# Patient Record
Sex: Male | Born: 1950
Health system: Southern US, Community
[De-identification: ages and names within clinical notes are randomized; demographics above are authoritative.]

## PROBLEM LIST (undated history)

## (undated) DIAGNOSIS — K5790 Diverticulosis of intestine, part unspecified, without perforation or abscess without bleeding: Secondary | ICD-10-CM

## (undated) DIAGNOSIS — I77810 Thoracic aortic ectasia: Secondary | ICD-10-CM

## (undated) DIAGNOSIS — J449 Chronic obstructive pulmonary disease, unspecified: Secondary | ICD-10-CM

## (undated) DIAGNOSIS — K648 Other hemorrhoids: Secondary | ICD-10-CM

## (undated) DIAGNOSIS — N529 Male erectile dysfunction, unspecified: Secondary | ICD-10-CM

## (undated) DIAGNOSIS — K7460 Unspecified cirrhosis of liver: Secondary | ICD-10-CM

## (undated) DIAGNOSIS — Z86010 Personal history of colonic polyps: Secondary | ICD-10-CM

## (undated) DIAGNOSIS — I10 Essential (primary) hypertension: Secondary | ICD-10-CM

## (undated) DIAGNOSIS — N400 Enlarged prostate without lower urinary tract symptoms: Secondary | ICD-10-CM

## (undated) DIAGNOSIS — F1010 Alcohol abuse, uncomplicated: Secondary | ICD-10-CM

## (undated) DIAGNOSIS — I509 Heart failure, unspecified: Secondary | ICD-10-CM

## (undated) DIAGNOSIS — N200 Calculus of kidney: Secondary | ICD-10-CM

## (undated) DIAGNOSIS — B1920 Unspecified viral hepatitis C without hepatic coma: Secondary | ICD-10-CM

## (undated) DIAGNOSIS — K644 Residual hemorrhoidal skin tags: Secondary | ICD-10-CM

## (undated) HISTORY — DX: Heart failure, unspecified: I50.9

## (undated) HISTORY — PX: COLONOSCOPY W/ POLYPECTOMY: SHX1380

## (undated) HISTORY — DX: Male erectile dysfunction, unspecified: N52.9

## (undated) HISTORY — DX: Residual hemorrhoidal skin tags: K64.4

## (undated) HISTORY — DX: Unspecified viral hepatitis C without hepatic coma: B19.20

## (undated) HISTORY — DX: Other hemorrhoids: K64.8

## (undated) HISTORY — DX: Calculus of kidney: N20.0

## (undated) HISTORY — DX: Unspecified cirrhosis of liver: K74.60

## (undated) HISTORY — DX: Chronic obstructive pulmonary disease, unspecified: J44.9

## (undated) HISTORY — DX: Diverticulosis of intestine, part unspecified, without perforation or abscess without bleeding: K57.90

## (undated) HISTORY — DX: Personal history of colonic polyps: Z86.010

## (undated) HISTORY — PX: HEMORRHOID SURGERY: SHX153

## (undated) HISTORY — DX: Essential (primary) hypertension: I10

## (undated) HISTORY — DX: Thoracic aortic ectasia: I77.810

## (undated) HISTORY — PX: OTHER SURGICAL HISTORY: SHX169

## (undated) HISTORY — DX: Alcohol abuse, uncomplicated: F10.10

## (undated) HISTORY — DX: Benign prostatic hyperplasia without lower urinary tract symptoms: N40.0

## (undated) HISTORY — PX: LEG SURGERY: SHX1003

---

## 1999-12-21 ENCOUNTER — Encounter: Payer: Self-pay | Admitting: Family Medicine

## 1999-12-21 ENCOUNTER — Encounter: Admission: RE | Admit: 1999-12-21 | Discharge: 1999-12-21 | Payer: Self-pay | Admitting: Family Medicine

## 2000-11-22 ENCOUNTER — Encounter: Payer: Self-pay | Admitting: Orthopedic Surgery

## 2000-11-22 ENCOUNTER — Inpatient Hospital Stay (HOSPITAL_COMMUNITY): Admission: RE | Admit: 2000-11-22 | Discharge: 2000-11-25 | Payer: Self-pay | Admitting: Orthopedic Surgery

## 2001-06-23 ENCOUNTER — Emergency Department (HOSPITAL_COMMUNITY): Admission: EM | Admit: 2001-06-23 | Discharge: 2001-06-24 | Payer: Self-pay | Admitting: Emergency Medicine

## 2002-11-24 ENCOUNTER — Encounter: Payer: Self-pay | Admitting: Internal Medicine

## 2004-02-02 ENCOUNTER — Encounter: Admission: RE | Admit: 2004-02-02 | Discharge: 2004-02-02 | Payer: Self-pay | Admitting: Family Medicine

## 2004-02-09 ENCOUNTER — Ambulatory Visit: Payer: Self-pay | Admitting: Internal Medicine

## 2004-03-03 ENCOUNTER — Ambulatory Visit: Payer: Self-pay | Admitting: Internal Medicine

## 2004-04-13 ENCOUNTER — Encounter (HOSPITAL_COMMUNITY): Admission: RE | Admit: 2004-04-13 | Discharge: 2004-07-12 | Payer: Self-pay | Admitting: Family Medicine

## 2004-04-20 ENCOUNTER — Encounter: Admission: RE | Admit: 2004-04-20 | Discharge: 2004-04-20 | Payer: Self-pay | Admitting: Family Medicine

## 2007-03-17 ENCOUNTER — Ambulatory Visit (HOSPITAL_COMMUNITY): Admission: RE | Admit: 2007-03-17 | Discharge: 2007-03-17 | Payer: Self-pay | Admitting: General Surgery

## 2007-03-17 ENCOUNTER — Encounter (INDEPENDENT_AMBULATORY_CARE_PROVIDER_SITE_OTHER): Payer: Self-pay | Admitting: General Surgery

## 2007-11-08 ENCOUNTER — Encounter: Admission: RE | Admit: 2007-11-08 | Discharge: 2007-11-08 | Payer: Self-pay | Admitting: Neurosurgery

## 2008-08-16 ENCOUNTER — Encounter: Admission: RE | Admit: 2008-08-16 | Discharge: 2008-08-16 | Payer: Self-pay | Admitting: Family Medicine

## 2008-08-16 ENCOUNTER — Encounter: Payer: Self-pay | Admitting: Internal Medicine

## 2008-09-01 DIAGNOSIS — E785 Hyperlipidemia, unspecified: Secondary | ICD-10-CM | POA: Insufficient documentation

## 2008-09-01 DIAGNOSIS — J449 Chronic obstructive pulmonary disease, unspecified: Secondary | ICD-10-CM | POA: Insufficient documentation

## 2008-09-01 DIAGNOSIS — J45909 Unspecified asthma, uncomplicated: Secondary | ICD-10-CM | POA: Insufficient documentation

## 2008-09-02 ENCOUNTER — Ambulatory Visit: Payer: Self-pay | Admitting: Internal Medicine

## 2008-09-02 DIAGNOSIS — R042 Hemoptysis: Secondary | ICD-10-CM | POA: Insufficient documentation

## 2008-10-14 ENCOUNTER — Ambulatory Visit: Payer: Self-pay | Admitting: Internal Medicine

## 2009-03-18 ENCOUNTER — Encounter (INDEPENDENT_AMBULATORY_CARE_PROVIDER_SITE_OTHER): Payer: Self-pay | Admitting: *Deleted

## 2009-04-11 ENCOUNTER — Encounter (INDEPENDENT_AMBULATORY_CARE_PROVIDER_SITE_OTHER): Payer: Self-pay | Admitting: *Deleted

## 2009-04-12 ENCOUNTER — Ambulatory Visit: Payer: Self-pay | Admitting: Internal Medicine

## 2009-04-26 ENCOUNTER — Ambulatory Visit: Payer: Self-pay | Admitting: Internal Medicine

## 2009-04-29 ENCOUNTER — Encounter: Payer: Self-pay | Admitting: Internal Medicine

## 2010-04-04 NOTE — Procedures (Signed)
Summary: Colonoscopy  Patient: Benjamin [name] Note: All result statuses are Final unless otherwise noted.  Tests: (1) Colonoscopy (COL)   COL Colonoscopy           DONE     [facility]     [address]     [place], [place]  [address]           COLONOSCOPY PROCEDURE REPORT           PATIENT:  Benjamin [name], Benjamin [name]  MR#:  [id number]     BIRTHDATE:  Jul 20, 1950, 58 yrs. old  GENDER:  male           ENDOSCOPIST:  Iva Boop, MD, [facility]           PROCEDURE DATE:  04/26/2009     PROCEDURE:  Colonoscopy with biopsy and snare polypectomy     ASA CLASS:  Class II     INDICATIONS:  history of pre-cancerous (adenomatous) colon polyps     surveillance/high-risk screening with prior adenomas (4 and 6 mm)     removed 9/04 (index colonoscopy)           MEDICATIONS:   Fentanyl 75 mcg IV, Versed 8 mg IV           DESCRIPTION OF PROCEDURE:   After the risks benefits and     alternatives of the procedure were thoroughly explained, informed     consent was obtained.  Digital rectal exam was performed and     revealed no abnormalities and normal prostate.   The LB CF-H180AL     [id number] endoscope was introduced through the anus and advanced to     the cecum, which was identified by both the appendix and ileocecal     valve, without limitations.  The quality of the prep was     excellent, using MoviPrep.  The instrument was then slowly     withdrawn as the colon was fully examined.     insertion: 4:02 minutes withdrawal: 22:48 minutes     <<PROCEDUREIMAGES>>           FINDINGS:  Two polyps were found in the right colon. 2 mm and 6 mm     polyps seen in ascending and transverse colon, respectively. The     2mm polyp was removed using cold biopsy forceps. The 6mm polyp was     snared without cautery. Retrieval was successful. Severe     diverticulosis was found in the sigmoid colon. Angulated and     stenosed lumen, making insertion moderately difficult.  A     pedunculated polyp was found in  the sigmoid colon. It was 15 mm in     size. Polyp was snared, then cauterized with monopolar cautery.     Retrieval was successful. snare polyp submucosal injection     endoscopic clip placement Some immediate bleeding of polypectomy     site (steady ooze) treated with 3cc 1:10K EPI and 1 clip.  This     was otherwise a normal examination of the colon.   Retroflexed     views in the rectum revealed internal hemorrhoids.    The scope     was then withdrawn from the patient and the procedure completed.           COMPLICATIONS:  None           ENDOSCOPIC IMPRESSION:     1) Two polyps in the right colon (2 and 6mm, removed)  2) Severe diverticulosis in the sigmoid colon     3) 15 mm pedunculated polyp in the sigmoid colon (removed -     immediate bleeding successfully treated - see above)     4) Internal hemorrhoids     5) Otherwise normal examination, excellent prep           6) Prior adenoma removal 11/2002 ( 6mm maximum)           RECOMMENDATIONS:     1) No aspirin or NSAID's for 2 weeks           REPEAT EXAM:  In for Colonoscopy, pending biopsy results.           Iva Boop, MD, [facility]           CC:  Lanell Persons, MD     The Patient           n.     eSIGNED:   Iva Boop at 04/26/2009 09:42 AM           Odette Horns, [id number]  Note: An exclamation mark (!) indicates a result that was not dispersed into the flowsheet. Document Creation Date: 04/26/2009 9:43 AM _______________________________________________________________________  (1) Order result status: Final Collection or observation date-time: 04/26/2009 09:30 Requested date-time:  Receipt date-time:  Reported date-time:  Referring Physician:   Ordering Physician: Stan Head [phone]) Specimen Source:  Source: Launa Grill Order Number: [id number] Lab site:   Appended Document: Colonoscopy colon recall 3 years  Appended Document: Colonoscopy     Procedures Next Due Date:    Colonoscopy:  05/2012

## 2010-04-04 NOTE — Letter (Signed)
Summary: Previsit letter  [facility]  [address] [place], [place] [address]   Phone: [phone]  Fax: [phone]       03/18/2009 MRN: [id number]  Benjamin [name] [address] [place], [place]  [address]  Dear [name],  Welcome to the Gastroenterology Division at [facility].    You are scheduled to see a nurse for your pre-procedure visit on 04-12-09 at 8:30a.m. on the 3rd floor at [facility], [address]  We ask that you try to arrive at our office 15 minutes prior to your appointment time to allow for check-in.  Your nurse visit will consist of discussing your medical and surgical history, your immediate family medical history, and your medications.    Please bring a complete list of all your medications or, if you prefer, bring the medication bottles and we will list them.  We will need to be aware of both prescribed and over the counter drugs.  We will need to know exact dosage information as well.  If you are on blood thinners (Coumadin, Plavix, Aggrenox, Ticlid, etc.) please call our office today/prior to your appointment, as we need to consult with your physician about holding your medication.   Please be prepared to read and sign documents such as consent forms, a financial agreement, and acknowledgement forms.  If necessary, and with your consent, a friend or relative is welcome to sit-in on the nurse visit with you.  Please bring your insurance card so that we may make a copy of it.  If your insurance requires a referral to see a specialist, please bring your referral form from your primary care physician.  No co-pay is required for this nurse visit.     If you cannot keep your appointment, please call [phone] to cancel or reschedule prior to your appointment date.  This allows [place] the opportunity to schedule an appointment for another patient in need of care.    Thank you for choosing  Gastroenterology for your medical  needs.  We appreciate the opportunity to care for you.  Please visit [place] at our website  to learn more about our practice.                     Sincerely.                                                                                                                   The Gastroenterology Division

## 2010-04-04 NOTE — Miscellaneous (Signed)
Summary: recall colon--ch.  Clinical Lists Changes  Medications: Added new medication of MOVIPREP 100 GM  SOLR (PEG-KCL-NACL-NASULF-NA ASC-C) As directed - Signed Rx of MOVIPREP 100 GM  SOLR (PEG-KCL-NACL-NASULF-NA ASC-C) As directed;  #1 x 0;  Signed;  Entered by: Clide Cliff RN;  Authorized by: Iva Boop MD, FACG;  Method used: Electronically to [facility]. [place]. [phone]*, [address], [place], [place], [place]  [address], Ph: [phone] or [phone], Fax: [phone] Allergies: Changed allergy or adverse reaction from [facility] to [facility]    Prescriptions: MOVIPREP 100 GM  SOLR (PEG-KCL-NACL-NASULF-NA ASC-C) As directed  #1 x 0   Entered by:   Clide Cliff RN   Authorized by:   Iva Boop MD, [facility]   Signed by:   Clide Cliff RN on 04/12/2009   Method used:   Electronically to        [employer] [facility]. [address] [phone]* (retail)       [address]       [place], [place]  [address]       Ph: [phone] or [phone]       Fax: [phone]   RxID:   [id number]

## 2010-04-04 NOTE — Letter (Signed)
Summary: Patient Notice- Polyp Results  [facility]  [address] [place], [place] [address]   Phone: [phone]  Fax: [phone]        April 29, 2009 MRN: [id number]    Benjamin [name] [address] [place], [place]  [address]    Dear Mr. Biel,  The polyps removed from your colon were adenomatous. This means that they were pre-cancerous or that  they had the potential to change into cancer over time.   I recommend that you have a repeat colonoscopy in 3 years to determine if you have developed any new polyps over time. If you develop any new rectal bleeding, abdominal pain or significant bowel habit changes, please contact us before then.  In addition to repeating colonoscopy, changing health habits may reduce your risk of having more colon polyps and possibly, colon cancer. You may lower your risk of future polyps and colon cancer by adopting healthy habits such as not smoking or using tobacco (if you do), being physically active, losing weight (if overweight), and eating a diet which includes fruits and vegetables and limits red meat.  Please call us if you are having persistent problems or have questions about your condition that have not been fully answered at this time.  Sincerely,  Iva Boop MD, [facility]  This letter has been electronically signed by your physician.  Appended Document: Patient Notice- Polyp Results Letter mailed 2.25.11

## 2010-04-04 NOTE — Letter (Signed)
Summary: [facility] Instructions  [facility]  [address] [place], [place] [address]   Phone: [phone]  Fax: [phone]       Benjamin [name]    1950/06/11    MRN: [id number]        Procedure Day /Date: Tuesday 04/26/09     Arrival Time: 8:00 am      Procedure Time: 9:00 am     Location of Procedure:                    _x _  [facility] (4th Floor)                        PREPARATION FOR COLONOSCOPY WITH MOVIPREP   Starting 5 days prior to your procedure 04/21/09 do not eat nuts, seeds, popcorn, corn, beans, peas,  salads, or any raw vegetables.  Do not take any fiber supplements (e.g. Metamucil, Citrucel, and Benefiber).  THE DAY BEFORE YOUR PROCEDURE         DATE: 04/25/09  DAY: Monday  1.  Drink clear liquids the entire day-NO SOLID FOOD  2.  Do not drink anything colored red or purple.  Avoid juices with pulp.  No orange juice.  3.  Drink at least 64 oz. (8 glasses) of fluid/clear liquids during the day to prevent dehydration and help the prep work efficiently.  CLEAR LIQUIDS INCLUDE: Water Jello Ice Popsicles Tea (sugar ok, no milk/cream) Powdered fruit flavored drinks Coffee (sugar ok, no milk/cream) Gatorade Juice: apple, white grape, white cranberry  Lemonade Clear bullion, consomm, broth Carbonated beverages (any kind) Strained chicken noodle soup Hard Candy                             4.  In the morning, mix first dose of MoviPrep solution:    Empty 1 Pouch A and 1 Pouch B into the disposable container    Add lukewarm drinking water to the top line of the container. Mix to dissolve    Refrigerate (mixed solution should be used within 24 hrs)  5.  Begin drinking the prep at 5:00 p.m. The MoviPrep container is divided by 4 marks.   Every 15 minutes drink the solution down to the next mark (approximately 8 oz) until the full liter is complete.   6.  Follow completed prep with 16 oz of clear liquid of your choice (Nothing  red or purple).  Continue to drink clear liquids until bedtime.  7.  Before going to bed, mix second dose of MoviPrep solution:    Empty 1 Pouch A and 1 Pouch B into the disposable container    Add lukewarm drinking water to the top line of the container. Mix to dissolve    Refrigerate  THE DAY OF YOUR PROCEDURE      DATE: 04/26/09 DAY: Tuesday  Beginning at 4:00 a.m. (5 hours before procedure):         1. Every 15 minutes, drink the solution down to the next mark (approx 8 oz) until the full liter is complete.  2. Follow completed prep with 16 oz. of clear liquid of your choice.    3. You may drink clear liquids until 7:00 am(2 HOURS BEFORE PROCEDURE).   MEDICATION INSTRUCTIONS  Unless otherwise instructed, you should take regular prescription medications with a small sip of water   as early as possible the morning of  your procedure.           OTHER INSTRUCTIONS  You will need a responsible adult at least 58 years of age to accompany you and drive you home.   This person must remain in the waiting room during your procedure.  Wear loose fitting clothing that is easily removed.  Leave jewelry and other valuables at home.  However, you may wish to bring a book to read or  an iPod/MP3 player to listen to music as you wait for your procedure to start.  Remove all body piercing jewelry and leave at home.  Total time from sign-in until discharge is approximately 2-3 hours.  You should go home directly after your procedure and rest.  You can resume normal activities the  day after your procedure.  The day of your procedure you should not:   Drive   Make legal decisions   Operate machinery   Drink alcohol   Return to work  You will receive specific instructions about eating, activities and medications before you leave.    The above instructions have been reviewed and explained to me by   Clide Cliff, RN______________________    I fully understand and  can verbalize these instructions _____________________________ Date _________

## 2010-07-18 NOTE — Op Note (Signed)
Benjamin [name], Benjamin [name]                ACCOUNT NO.:  [id number]   MEDICAL RECORD NO.:  [id number]          PATIENT TYPE:  AMB   LOCATION:  SDS                          FACILITY:  [facility]   PHYSICIAN:  Ollen Gross. Vernell Morgans, M.D. DATE OF BIRTH:  1950-04-02   DATE OF PROCEDURE:  03/17/2007  DATE OF DISCHARGE:                               OPERATIVE REPORT   PREOPERATIVE DIAGNOSIS:  Internal and external hemorrhoids.   POSTOPERATIVE DIAGNOSIS:  Internal and external hemorrhoids.   PROCEDURE:  Two column hemorrhoidectomy and one internal hemorrhoid  banding.   SURGEON:  Ollen Gross. Vernell Morgans, M.D.   ANESTHESIA:  General endotracheal.   PROCEDURE:  After informed consent was obtained, the patient was brought  to the operating room, placed in supine position on the table.  After  adequate induction of general anesthesia, the patient was put in  lithotomy position.  His perirectal area was then prepped with Betadine  and draped in usual sterile manner.  The patient had significant  internal and external hemorrhoids.  His perirectal area was infiltrated  with 1 mL of Wydase and 9 mL of Marcaine with epinephrine and the tissue  was massaged gently for several minutes.  Bullet retractor was then  placed in the rectum and the rectum was examined.  In the left and right  posterior positions, the patient had a large internal and external  hemorrhoid complex.  Anteriorly, the patient had one large internal  hemorrhoid.  Attention was first turned to the right posterior  hemorrhoid complex.  This internal and external hemorrhoid complex was  grasped with two Allis clamps and elevated.  The base of this hemorrhoid  complex was scored with a 15 blade knife and then a hemostat was placed  across the vessels.  The hemorrhoid was then excised on top of the  hemostat with Metzenbaum scissors.  The incision was then closed with a  running 2-0 chromic stitch.  The last few mm of the wound was left open  for  drainage.  Another figure-of-eight 2-0 chromic stitch was used for  hemostasis along the midportion of the incision.  Once this was  accomplished, the incision looked good and was hemostatic.  The left  posterior hemorrhoid complex was excised in a similar manner.  It was  clamped and elevated with Allis clamps.  The base of it was scored with  15 blade knife and a hemostat was placed across the vessels.  The  hemorrhoid was then excised with Metzenbaum scissors and the incision  was closed with a running 2-0 chromic stitch.  The last few millimeters  of the  incision were left open for drainage.  Once this was  accomplished, the wound was well approximated and clean and hemostatic.  The anterior internal hemorrhoid complex was then banded without  difficulty.  Care was taken to make sure the band was deep to the  dentate line.  Once this was accomplished, the wounds looked good.  The  perirectal area was infiltrated with the rest of 0.25% Marcaine with  epinephrine.  A piece of Gelfoam with  dibucaine ointment  was placed in the rectum and dibucaine ointment was placed on the  outside of the rectum as well and then sterile dressings were applied.  The patient tolerated procedure well.  At end of the case all needle,  sponge, instrument counts correct.  The patient was awakened, taken  recovery in stable condition.      Ollen Gross. Vernell Morgans, M.D.  Electronically Signed     PST/MEDQ  D:  03/17/2007  T:  03/17/2007  Job:  [id number]

## 2010-07-21 NOTE — Op Note (Signed)
[place]  Patient:    Benjamin [name], Benjamin [name] Visit Number: [id number] MRN: [id number]          Service Type: DSU Location: 5000 [phone] Attending Physician:  Twana First Dictated by:   Benjamin [name] Benjamin [name], M.D. Proc. Date: 11/22/00 Admit Date:  11/22/2000                             Operative Report  PREOPERATIVE DIAGNOSIS:   Right tibial plateau fracture.  POSTOPERATIVE DIAGNOSIS:  Right tibial plateau fracture.  OPERATION:  Open reduction and internal fixation of right tibial plateau fracture with allograft bone graft.  SURGEON:  Benjamin [name]. Benjamin [name], M.D.  ASSISTANT:  Benjamin [name], P.A.  ANESTHESIA:  General.  OPERATIVE TIME:  1 hour and 30 minutes.  COMPLICATIONS:  None.  DESCRIPTION OF PROCEDURE:  Benjamin [name] was brought to the operating room on November 22, 2000, placed on the operating table in a supine position.  He had received vancomycin IV preoperatively for prophylaxis.  After being placed under general anesthesia, his right leg was prepped using sterile Betadine and draped using sterile technique.  Leg was exsanguinated, and thigh tourniquet elevated to 375 mmHg.  Initially through a 20 cm long single incision based over the patellar tendon and proximal tibia, initial exposure was made.  The underlying subcutaneous tissues were incised along with the skin incision.  A median arthrotomy was performed revealing a large hemarthrosis which was evacuated.  The anterior and medial musculature was subperiosteally dissected off the proximal medial tibia for exposure of the medial aspect of the tibial plateau fracture which was easily identified, and the pes insertions of the hamstrings were subperiosteally dissected as well to be repaired at the end of the case.  A lateral arthrotomy was then also performed for exposure to the lateral aspect of the joint.  He was found to have a significant depressed component to his lateral  tibial plateau, and this was elevated up with Benjamin [name] elevators, and then allograft bone graft was packed underneath this to buttress the lateral tibial plateau back up into place.  After this was done, then an eight-[name] 4.5 DCT spoon plate was placed on the proximal medial tibia, and, under fluoroscopic control, the two most proximal screw holes had guidewires for the cannulated 7.3 mm screws placed through them across the fracture site parallel to the joint line buttressing the tibial plateau.  AP and lateral x-rays confirmed satisfactory position of these guide pins.  They were then overdrilled with a 4.5 mm drill, and then two separate 80 x 7.3 mm screws were placed, thus securing and buttressing the proximal aspect of the tibial plateau.  The three most distal screw holes were then drilled, measured, tapped in the appropriate length 4.5 mm cortical screws placed, thus securing the medial tibial plateau fracture back to the shaft in an anatomic position.  Another 6.5 mm screw was then placed proximally, again across the fracture site parallel to the joint line, further reinforcing the proximal tibial plateau fracture.  After this was done, there was found to be excellent fixation of the fracture, excellent position of the hardware.  At this point, then, the lateral meniscus, which had been partially detached to gain access to the lateral joint, was repaired using 2-0 Ethibond suture.  The pes hamstring tendons were reattached to their insertion just over the plate with 0 Vicryl suture.  The arthrotomies were closed with  0 Vicryl suture as well. The subcutaneous tissues were then closed with 0 and 2-0 Vicryl, skin closed with skin staples.  Sterile dressing were applied and a long leg splint. Tourniquet was released and the patient awakened and taken to the recovery room in stable condition.  The wound had been copiously irrigated prior to closing this.  Needles and sponge counts were  correct x 2 at the end of the case. Dictated by:   Benjamin [name] Benjamin [name], M.D. Attending Physician:  Twana First DD:  11/22/00 TD:  11/22/00 Job: [id number]

## 2010-07-21 NOTE — Discharge Summary (Signed)
[place]  Patient:    Benjamin [name], Benjamin [name] Visit Number: [id number] MRN: [id number]          Service Type: DSU Location: 5000 [phone] Attending Physician:  Twana First Dictated by:   Julien Girt, P.A.C. Admit Date:  11/22/2000 Discharge Date: 11/25/2000                             Discharge Summary  ADMISSION DIAGNOSES: 1. Proximal tibia fracture. 2. Reflux. 3. Emphysema.  DISCHARGE DIAGNOSES: 1. Tibial plateau fracture, status post open reduction and internal fixation. 2. Reflux. 3. Emphysema.  PROCEDURES IN HOUSE:  On November 22, 2000, the patient underwent open reduction and internal fixation of his right tibial plateau fracture.  HOSPITAL COURSE:  Postoperatively, he was admitted for pain control and physical therapy.  Postop day #1, T-max 102, hemoglobin 11.7, white cells were 9.1.  BMET was within normal limits.  The patient was seen by physical therapy.  Postop day #2, the patient doing well. Distal neurovascular exam is intact.  T-max 100.2, now of 100.9.  Surgical wound is well-approximated. Pain medicine was increased to OxyContin CR 20 mg b.i.d. and OxyIR.  Postop day #3, the patient progressed better with pain control.  T-max was 101. Lungs had a slightly productive cough.  He has a history of emphysema.  He was discharged to home in stable condition.  Home health nursing for Lovenox teaching, home health PT for ambulation.  He is nonweightbearing on his right lower extremity.  He is on a regular diet.  DISCHARGE MEDICATIONS: 1. OxyContin CR 20 mg b.i.d. 2. OxyIR one to two q.4-6h. p.r.n. pain. 3. Colace 100 mg b.i.d.  FOLLOW-UP:  Will see him back in the office in seven days for suture removal. ictated by:   Julien Girt, P.A.C. Attending Physician:  Twana First DD:  01/22/01 TD:  01/22/01 Job: [id number]

## 2010-11-22 LAB — BASIC METABOLIC PANEL
CO2: 29
Calcium: 10
Creatinine, Ser: 0.84
GFR calc Af Amer: >60
GFR calc non Af Amer: >60
Sodium: 140

## 2010-11-22 LAB — CBC
Hemoglobin: 15.3
MCHC: 34.6
RBC: 5
WBC: 7.5

## 2010-11-22 LAB — DIFFERENTIAL
Basophils Relative: 1
Lymphocytes Relative: 29
Lymphs Abs: 2.2
Monocytes Absolute: 0.7
Monocytes Relative: 9
Neutro Abs: 4.6
Neutrophils Relative %: 61

## 2012-05-15 ENCOUNTER — Encounter: Payer: Self-pay | Admitting: Internal Medicine

## 2012-05-15 DIAGNOSIS — Z860100 Personal history of colon polyps, unspecified: Secondary | ICD-10-CM

## 2012-05-15 HISTORY — DX: Personal history of colon polyps, unspecified: Z86.0100

## 2012-05-15 HISTORY — DX: Personal history of colonic polyps: Z86.010

## 2012-05-16 ENCOUNTER — Encounter: Payer: Self-pay | Admitting: Internal Medicine

## 2012-05-26 ENCOUNTER — Encounter: Payer: Self-pay | Admitting: Internal Medicine

## 2012-07-01 ENCOUNTER — Ambulatory Visit (AMBULATORY_SURGERY_CENTER): Payer: BC Managed Care – PPO

## 2012-07-01 ENCOUNTER — Encounter: Payer: Self-pay | Admitting: Internal Medicine

## 2012-07-01 VITALS — Ht 70.5 in | Wt 200.4 lb

## 2012-07-01 DIAGNOSIS — Z860100 Personal history of colon polyps, unspecified: Secondary | ICD-10-CM

## 2012-07-01 DIAGNOSIS — Z1211 Encounter for screening for malignant neoplasm of colon: Secondary | ICD-10-CM

## 2012-07-01 MED ORDER — NA SULFATE-K SULFATE-MG SULF 17.5-3.13-1.6 GM/177ML PO SOLN: 1.0000 | Freq: Once | ORAL | Status: DC

## 2012-07-15 ENCOUNTER — Ambulatory Visit (AMBULATORY_SURGERY_CENTER): Payer: BC Managed Care – PPO | Admitting: Internal Medicine

## 2012-07-15 ENCOUNTER — Encounter: Payer: Self-pay | Admitting: Internal Medicine

## 2012-07-15 VITALS — BP 143/96 | HR 53 | Temp 97.2°F | Resp 18 | Ht 70.5 in | Wt 200.0 lb

## 2012-07-15 DIAGNOSIS — K5730 Diverticulosis of large intestine without perforation or abscess without bleeding: Secondary | ICD-10-CM

## 2012-07-15 DIAGNOSIS — K648 Other hemorrhoids: Secondary | ICD-10-CM

## 2012-07-15 DIAGNOSIS — Z860100 Personal history of colon polyps, unspecified: Secondary | ICD-10-CM

## 2012-07-15 DIAGNOSIS — K644 Residual hemorrhoidal skin tags: Secondary | ICD-10-CM

## 2012-07-15 DIAGNOSIS — Z1211 Encounter for screening for malignant neoplasm of colon: Secondary | ICD-10-CM

## 2012-07-15 MED ORDER — SODIUM CHLORIDE 0.9 % IV SOLN: 500.0000 mL | INTRAVENOUS | Status: DC

## 2012-07-15 NOTE — Op Note (Signed)
[facility] [address] [place], [address]   COLONOSCOPY PROCEDURE REPORT  PATIENT: [name], Benjamin  MR#: [id number] BIRTHDATE: [birth year] , 62  yrs. old GENDER: Male ENDOSCOPIST: Iva Boop, MD, [facility] PROCEDURE DATE:  07/15/2012 PROCEDURE:   Colonoscopy, surveillance and Colonoscopy, screening  ASA CLASS:   Class II INDICATIONS:Screening and surveillance,personal history of colonic polyps. MEDICATIONS: propofol (Diprivan) 250mg  IV, MAC sedation, administered by CRNA, and These medications were titrated to patient response per physician's verbal order  DESCRIPTION OF PROCEDURE:   After the risks benefits and alternatives of the procedure were thoroughly explained, informed consent was obtained.  A digital rectal exam revealed no rectal mass, A digital rectal exam revealed no prostatic nodules, and A digital rectal exam revealed the prostate was not enlarged.   The LB CF-[id number]  endoscope was introduced through the anus and advanced to the cecum, which was identified by both the appendix and ileocecal valve. No adverse events experienced.   The quality of the prep was excellent using Suprep  The instrument was then slowly withdrawn as the colon was fully examined.    COLON FINDINGS: There was severe diverticulosis noted in the sigmoid colon with associated angulation, muscular hypertrophy and luminal narrowing.   Moderate sized internal and external hemorrhoids were found.   The colon mucosa was otherwise normal.   A right colon retroflexion was performed.  Retroflexed views revealed internal/external hemorrhoids. The time to cecum=5 minutes 0 seconds.  Withdrawal time=9 minutes 0 seconds.  The scope was withdrawn and the procedure completed. COMPLICATIONS: There were no complications.  ENDOSCOPIC IMPRESSION: 1.   There was severe diverticulosis noted in the sigmoid colon 2.   Moderate sized internal and external hemorrhoids 3.   The colon  mucosa was otherwise normal - excellent prep in patient w/ 6 mm adenoma 2005 and 3 adenomas 2011, max 15 mm  RECOMMENDATIONS: Repeat Colonoscopy in 5 years.   eSigned:  Iva Boop, MD, [facility] 07/15/2012 11:54 AM  cc: Beverley Fiedler, MD and The Patient

## 2012-07-15 NOTE — Progress Notes (Signed)
Patient did not experience any of the following events: a burn prior to discharge; a fall within the facility; wrong site/side/patient/procedure/implant event; or a hospital transfer or hospital admission upon discharge from the facility. (G8907) Patient did not have preoperative order for IV antibiotic SSI prophylaxis. (G8918)  

## 2012-07-15 NOTE — Patient Instructions (Addendum)
No polyps today!  You had a great prep also.  I did see diverticulosis and hemorrhoids. If your hemorrhoids bother you - let me know - I am starting an office procedure soon that can help you with these.  Next routine colonoscopy in 5 years - 2019.  I appreciate the opportunity to care for you.  Iva Boop, MD, FACG YOU HAD AN ENDOSCOPIC PROCEDURE TODAY AT THE [facility]: Refer to the procedure report that was given to you for any specific questions about what was found during the examination.  If the procedure report does not answer your questions, please call your gastroenterologist to clarify.  If you requested that your care partner not be given the details of your procedure findings, then the procedure report has been included in a sealed envelope for you to review at your convenience later.  YOU SHOULD EXPECT: Some feelings of bloating in the abdomen. Passage of more gas than usual.  Walking can help get rid of the air that was put into your GI tract during the procedure and reduce the bloating. If you had a lower endoscopy (such as a colonoscopy or flexible sigmoidoscopy) you may notice spotting of blood in your stool or on the toilet paper. If you underwent a bowel prep for your procedure, then you may not have a normal bowel movement for a few days.  DIET: Your first meal following the procedure should be a light meal and then it is ok to progress to your normal diet.  A half-sandwich or bowl of soup is an example of a good first meal.  Heavy or fried foods are harder to digest and may make you feel nauseous or bloated.  Likewise meals heavy in dairy and vegetables can cause extra gas to form and this can also increase the bloating.  Drink plenty of fluids but you should avoid alcoholic beverages for 24 hours.  ACTIVITY: Your care partner should take you home directly after the procedure.  You should plan to take it easy, moving slowly for the rest of the day.  You can  resume normal activity the day after the procedure however you should NOT DRIVE or use heavy machinery for 24 hours (because of the sedation medicines used during the test).    SYMPTOMS TO REPORT IMMEDIATELY: A gastroenterologist can be reached at any hour.  During normal business hours, 8:30 AM to 5:00 PM Monday through Friday, call [phone].  After hours and on weekends, please call the GI answering service at [phone] who will take a message and have the physician on call contact you.   Following lower endoscopy (colonoscopy or flexible sigmoidoscopy):  Excessive amounts of blood in the stool  Significant tenderness or worsening of abdominal pains  Swelling of the abdomen that is new, acute  Fever of 100F or higher   FOLLOW UP: If any biopsies were taken you will be contacted by phone or by letter within the next 1-3 weeks.  Call your gastroenterologist if you have not heard about the biopsies in 3 weeks.  Our staff will call the home number listed on your records the next business day following your procedure to check on you and address any questions or concerns that you may have at that time regarding the information given to you following your procedure. This is a courtesy call and so if there is no answer at the home number and we have not heard from you through the emergency physician on  call, we will assume that you have returned to your regular daily activities without incident.  SIGNATURES/CONFIDENTIALITY: You and/or your care partner have signed paperwork which will be entered into your electronic medical record.  These signatures attest to the fact that that the information above on your After Visit Summary has been reviewed and is understood.  Full responsibility of the confidentiality of this discharge information lies with you and/or your care-partner.   Diverticulosis, hemorrhoid information given.  Repeat colonoscopy in 5 years-2019

## 2012-07-16 ENCOUNTER — Telehealth: Payer: Self-pay | Admitting: *Deleted

## 2012-07-16 NOTE — Telephone Encounter (Signed)
No answer. Number identifier. Message left to call if any questions or concerns. 

## 2013-07-16 ENCOUNTER — Other Ambulatory Visit: Payer: Self-pay | Admitting: Family Medicine

## 2013-07-16 DIAGNOSIS — M545 Low back pain: Principal | ICD-10-CM

## 2013-07-16 DIAGNOSIS — G8929 Other chronic pain: Secondary | ICD-10-CM

## 2013-07-18 ENCOUNTER — Ambulatory Visit
Admission: RE | Admit: 2013-07-18 | Discharge: 2013-07-18 | Disposition: A | Discharge status: Home / Self Care | Payer: BC Managed Care – PPO | Source: Ambulatory Visit | Attending: Family Medicine | Admitting: Family Medicine

## 2013-07-18 ENCOUNTER — Other Ambulatory Visit: Payer: BC Managed Care – PPO

## 2013-07-18 DIAGNOSIS — G8929 Other chronic pain: Secondary | ICD-10-CM

## 2013-07-18 DIAGNOSIS — M545 Low back pain: Principal | ICD-10-CM

## 2013-09-25 ENCOUNTER — Other Ambulatory Visit: Payer: Self-pay | Admitting: Neurosurgery

## 2013-09-25 DIAGNOSIS — M47816 Spondylosis without myelopathy or radiculopathy, lumbar region: Secondary | ICD-10-CM

## 2013-09-30 ENCOUNTER — Ambulatory Visit
Admission: RE | Admit: 2013-09-30 | Discharge: 2013-09-30 | Disposition: A | Discharge status: Home / Self Care | Payer: BC Managed Care – PPO | Source: Ambulatory Visit | Attending: Neurosurgery | Admitting: Neurosurgery

## 2013-09-30 VITALS — BP 119/82 | HR 71

## 2013-09-30 DIAGNOSIS — M47816 Spondylosis without myelopathy or radiculopathy, lumbar region: Secondary | ICD-10-CM

## 2013-09-30 MED ORDER — METHYLPREDNISOLONE ACETATE 40 MG/ML INJ SUSP (RADIOLOG
120.0000 mg | Freq: Once | INTRAMUSCULAR | Status: AC
  Administered 2013-09-30: 120 mg via EPIDURAL

## 2013-09-30 MED ORDER — DIAZEPAM 5 MG PO TABS
10.0000 mg | ORAL_TABLET | Freq: Once | ORAL | Status: AC
  Administered 2013-09-30: 10 mg via ORAL

## 2013-09-30 MED ORDER — IOHEXOL 180 MG/ML  SOLN
1.0000 mL | Freq: Once | INTRAMUSCULAR | Status: AC | PRN
  Administered 2013-09-30: 1 mL via EPIDURAL

## 2013-09-30 NOTE — Discharge Instructions (Signed)

## 2013-12-24 ENCOUNTER — Encounter (HOSPITAL_COMMUNITY): Payer: Self-pay | Admitting: Radiology

## 2013-12-24 ENCOUNTER — Emergency Department (HOSPITAL_COMMUNITY)
Admission: EM | Admit: 2013-12-24 | Discharge: 2013-12-24 | Disposition: A | Discharge status: Home / Self Care | Payer: BC Managed Care – PPO | Attending: Emergency Medicine | Admitting: Emergency Medicine

## 2013-12-24 ENCOUNTER — Emergency Department (HOSPITAL_COMMUNITY): Payer: BC Managed Care – PPO

## 2013-12-24 DIAGNOSIS — N201 Calculus of ureter: Secondary | ICD-10-CM | POA: Insufficient documentation

## 2013-12-24 DIAGNOSIS — I10 Essential (primary) hypertension: Secondary | ICD-10-CM | POA: Insufficient documentation

## 2013-12-24 DIAGNOSIS — Z79899 Other long term (current) drug therapy: Secondary | ICD-10-CM | POA: Diagnosis not present

## 2013-12-24 DIAGNOSIS — N400 Enlarged prostate without lower urinary tract symptoms: Secondary | ICD-10-CM | POA: Insufficient documentation

## 2013-12-24 DIAGNOSIS — Z86010 Personal history of colonic polyps: Secondary | ICD-10-CM | POA: Insufficient documentation

## 2013-12-24 DIAGNOSIS — Z880 Allergy status to penicillin: Secondary | ICD-10-CM | POA: Insufficient documentation

## 2013-12-24 DIAGNOSIS — J449 Chronic obstructive pulmonary disease, unspecified: Secondary | ICD-10-CM | POA: Insufficient documentation

## 2013-12-24 DIAGNOSIS — Z7982 Long term (current) use of aspirin: Secondary | ICD-10-CM | POA: Diagnosis not present

## 2013-12-24 DIAGNOSIS — Z87891 Personal history of nicotine dependence: Secondary | ICD-10-CM | POA: Diagnosis not present

## 2013-12-24 DIAGNOSIS — R1031 Right lower quadrant pain: Secondary | ICD-10-CM | POA: Diagnosis present

## 2013-12-24 LAB — URINALYSIS, ROUTINE W REFLEX MICROSCOPIC
BILIRUBIN URINE: NEGATIVE
Glucose, UA: NEGATIVE mg/dL
KETONES UR: 15 mg/dL — AB
LEUKOCYTES UA: NEGATIVE
NITRITE: NEGATIVE
Protein, ur: NEGATIVE mg/dL
Specific Gravity, Urine: 1.016 (ref 1.005–1.030)
Urobilinogen, UA: 0.2 mg/dL (ref 0.0–1.0)
pH: 7.5 (ref 5.0–8.0)

## 2013-12-24 LAB — COMPREHENSIVE METABOLIC PANEL
ALT: 25 U/L (ref 0–53)
AST: 28 U/L (ref 0–37)
Albumin: 4.1 g/dL (ref 3.5–5.2)
Alkaline Phosphatase: 54 U/L (ref 39–117)
Anion gap: 15 (ref 5–15)
BUN: 12 mg/dL (ref 6–23)
CO2: 25 meq/L (ref 19–32)
Calcium: 9.6 mg/dL (ref 8.4–10.5)
Chloride: 100 mEq/L (ref 96–112)
Creatinine, Ser: 1.03 mg/dL (ref 0.50–1.35)
GFR calc non Af Amer: 76 mL/min — ABNORMAL LOW (ref >90)
GFR, EST AFRICAN AMERICAN: 89 mL/min — AB (ref >90)
Glucose, Bld: 151 mg/dL — ABNORMAL HIGH (ref 70–99)
Potassium: 4.4 mEq/L (ref 3.7–5.3)
Sodium: 140 mEq/L (ref 137–147)
TOTAL PROTEIN: 7.8 g/dL (ref 6.0–8.3)
Total Bilirubin: 0.7 mg/dL (ref 0.3–1.2)

## 2013-12-24 LAB — CBC WITH DIFFERENTIAL/PLATELET
BASOS ABS: 0 10*3/uL (ref 0.0–0.1)
Basophils Relative: 0 % (ref 0–1)
EOS ABS: 0 10*3/uL (ref 0.0–0.7)
EOS PCT: 0 % (ref 0–5)
HEMATOCRIT: 43.1 % (ref 39.0–52.0)
Hemoglobin: 14.8 g/dL (ref 13.0–17.0)
Lymphocytes Relative: 12 % (ref 12–46)
Lymphs Abs: 1.1 10*3/uL (ref 0.7–4.0)
MCH: 30.6 pg (ref 26.0–34.0)
MCHC: 34.3 g/dL (ref 30.0–36.0)
MCV: 89.2 fL (ref 78.0–100.0)
Monocytes Absolute: 0.3 10*3/uL (ref 0.1–1.0)
Monocytes Relative: 3 % (ref 3–12)
Neutro Abs: 8.5 10*3/uL — ABNORMAL HIGH (ref 1.7–7.7)
Neutrophils Relative %: 85 % — ABNORMAL HIGH (ref 43–77)
PLATELETS: 211 10*3/uL (ref 150–400)
RBC: 4.83 MIL/uL (ref 4.22–5.81)
RDW: 12.7 % (ref 11.5–15.5)
WBC: 9.9 10*3/uL (ref 4.0–10.5)

## 2013-12-24 LAB — URINE MICROSCOPIC-ADD ON

## 2013-12-24 MED ORDER — ONDANSETRON 8 MG PO TBDP: 8.0000 mg | ORAL_TABLET | Freq: Three times a day (TID) | ORAL | Status: DC | PRN

## 2013-12-24 MED ORDER — HYDROMORPHONE HCL 1 MG/ML IJ SOLN
1.0000 mg | Freq: Once | INTRAMUSCULAR | Status: AC
  Administered 2013-12-24: 1 mg via INTRAVENOUS
  Filled 2013-12-24: qty 1

## 2013-12-24 MED ORDER — IOHEXOL 300 MG/ML  SOLN
100.0000 mL | Freq: Once | INTRAMUSCULAR | Status: AC | PRN
  Administered 2013-12-24: 100 mL via INTRAVENOUS

## 2013-12-24 MED ORDER — IOHEXOL 300 MG/ML  SOLN
25.0000 mL | Freq: Once | INTRAMUSCULAR | Status: AC | PRN
  Administered 2013-12-24: 25 mL via ORAL

## 2013-12-24 MED ORDER — HYDROMORPHONE HCL 1 MG/ML IJ SOLN
1.0000 mg | Freq: Once | INTRAMUSCULAR | Status: AC
Start: 2013-12-24 — End: 2013-12-24
  Administered 2013-12-24: 1 mg via INTRAVENOUS
  Filled 2013-12-24: qty 1

## 2013-12-24 MED ORDER — IBUPROFEN 600 MG PO TABS: 600.0000 mg | ORAL_TABLET | Freq: Three times a day (TID) | ORAL | Status: AC | PRN

## 2013-12-24 MED ORDER — OXYCODONE-ACETAMINOPHEN 5-325 MG PO TABS: 1.0000 | ORAL_TABLET | ORAL | Status: DC | PRN

## 2013-12-24 MED ORDER — SODIUM CHLORIDE 0.9 % IV SOLN
1000.0000 mL | Freq: Once | INTRAVENOUS | Status: AC
Start: 2013-12-24 — End: 2013-12-24
  Administered 2013-12-24: 1000 mL via INTRAVENOUS

## 2013-12-24 MED ORDER — SODIUM CHLORIDE 0.9 % IV SOLN
1000.0000 mL | INTRAVENOUS | Status: DC
  Administered 2013-12-24: 1000 mL via INTRAVENOUS

## 2013-12-24 MED ORDER — ONDANSETRON HCL 4 MG/2ML IJ SOLN
4.0000 mg | Freq: Once | INTRAMUSCULAR | Status: AC
  Administered 2013-12-24: 4 mg via INTRAVENOUS
  Filled 2013-12-24: qty 2

## 2013-12-24 MED ORDER — KETOROLAC TROMETHAMINE 30 MG/ML IJ SOLN
30.0000 mg | Freq: Once | INTRAMUSCULAR | Status: AC
  Administered 2013-12-24: 30 mg via INTRAVENOUS
  Filled 2013-12-24: qty 1

## 2013-12-24 NOTE — ED Notes (Signed)
Pt alert and oriented at discharge.  Pt ambulatory to the waiting room by this RN.  Pt discharged home with significant other.

## 2013-12-24 NOTE — Discharge Instructions (Signed)

## 2013-12-24 NOTE — ED Provider Notes (Signed)
CSN: [id number]     Arrival date & time 12/24/13  6283 History   First MD Initiated Contact with Patient 12/24/13 1010     Chief Complaint  Patient presents with  . Abdominal Pain      HPI Patient reports developing right groin and right testicular pain last night now with radiation up into his right lower abdomen and his right flank.  No prior history kidney stones.  Reports subjective fever at home.  No urinary complaints.  One episode of nausea vomiting.  No diarrhea.  Denies left-sided abdominal pain.  No prior history of abdominal pain or testicular pain such as this.  He reports his scrotal and testicular pain and discomfort has resolved.   Past Medical History  Diagnosis Date  . Personal history of colonic adenomas 05/15/2012  . COPD (chronic obstructive pulmonary disease)   . Hypertension   . BPH (benign prostatic hyperplasia)    Past Surgical History  Procedure Laterality Date  . Colonoscopy w/ polypectomy    . Dental implants      upper  . Hemorrhoid surgery    . Leg surgery      fx- right repair plate screw   Family History  Problem Relation Age of Onset  . Ovarian cancer Mother   . Prostate cancer Father   . Colon cancer Neg Hx    History  Substance Use Topics  . Smoking status: Former Research scientist (life sciences)  . Smokeless tobacco: Never Used  . Alcohol Use: No    Review of Systems  All other systems reviewed and are negative.     Allergies  Cephalexin and Penicillins  Home Medications   Prior to Admission medications   Medication Sig Start Date End Date Taking? Authorizing Provider  albuterol (PROVENTIL HFA;VENTOLIN HFA) 108 (90 BASE) MCG/ACT inhaler Inhale 2 puffs into the lungs every 6 (six) hours as needed for wheezing or shortness of breath.   Yes Historical Provider, MD  alfuzosin (UROXATRAL) 10 MG 24 hr tablet Take 10 mg by mouth daily with breakfast.   Yes Historical Provider, MD  Ascorbic Acid (VITAMIN C) 1000 MG tablet Take 1,000 mg by mouth daily.   Yes  Historical Provider, MD  aspirin 81 MG tablet Take 81 mg by mouth daily.   Yes Historical Provider, MD  budesonide-formoterol (SYMBICORT) 160-4.5 MCG/ACT inhaler Inhale 2 puffs into the lungs 2 (two) times daily.    Yes Historical Provider, MD  lisinopril (PRINIVIL,ZESTRIL) 20 MG tablet Take 20 mg by mouth daily.   Yes Historical Provider, MD  Multiple Vitamin (MULTIVITAMIN) tablet Take 1 tablet by mouth daily.   Yes Historical Provider, MD  ibuprofen (ADVIL,MOTRIN) 600 MG tablet Take 1 tablet (600 mg total) by mouth every 8 (eight) hours as needed. 12/24/13   Hoy Morn, MD  ondansetron (ZOFRAN ODT) 8 MG disintegrating tablet Take 1 tablet (8 mg total) by mouth every 8 (eight) hours as needed for nausea or vomiting. 12/24/13   Hoy Morn, MD  oxyCODONE-acetaminophen (PERCOCET/ROXICET) 5-325 MG per tablet Take 1 tablet by mouth every 4 (four) hours as needed for severe pain. 12/24/13   Hoy Morn, MD   BP 167/82  Pulse 88  Temp(Src) 99.6 F (37.6 C) (Oral)  Resp 17  SpO2 93% Physical Exam  Nursing note and vitals reviewed. Constitutional: He is oriented to person, place, and time. He appears well-developed and well-nourished.  HENT:  Head: Normocephalic and atraumatic.  Eyes: EOM are normal.  Neck: Normal range of motion.  Cardiovascular: Normal rate, regular rhythm, normal heart sounds and intact distal pulses.   Pulmonary/Chest: Effort normal and breath sounds normal. No respiratory distress.  Abdominal: Soft. He exhibits no distension. There is no tenderness.  Genitourinary:  Normal penis and scrotum.  Testicles normal bilaterally.  No right inguinal swelling or tenderness  Musculoskeletal: Normal range of motion.  Neurological: He is alert and oriented to person, place, and time.  Skin: Skin is warm and dry.  Psychiatric: He has a normal mood and affect. Judgment normal.    ED Course  Procedures (including critical care time) Labs Review Labs Reviewed  CBC WITH  DIFFERENTIAL - Abnormal; Notable for the following:    Neutrophils Relative % 85 (*)    Neutro Abs 8.5 (*)    All other components within normal limits  URINALYSIS, ROUTINE W REFLEX MICROSCOPIC - Abnormal; Notable for the following:    Hgb urine dipstick LARGE (*)    Ketones, ur 15 (*)    All other components within normal limits  COMPREHENSIVE METABOLIC PANEL - Abnormal; Notable for the following:    Glucose, Bld 151 (*)    GFR calc non Af Amer 76 (*)    GFR calc Af Amer 89 (*)    All other components within normal limits  URINE MICROSCOPIC-ADD ON    Imaging Review Ct Abdomen Pelvis W Contrast  12/24/2013   CLINICAL DATA:  Right lower groin pain in right lower quadrant.  EXAM: CT ABDOMEN AND PELVIS WITH CONTRAST  TECHNIQUE: Multidetector CT imaging of the abdomen and pelvis was performed using the standard protocol following bolus administration of intravenous contrast.  CONTRAST:  148mL OMNIPAQUE IOHEXOL 300 MG/ML  SOLN  COMPARISON:  None.  FINDINGS: The lung bases are clear.  The liver demonstrates no focal abnormality. There is no intrahepatic or extrahepatic biliary ductal dilatation. The gallbladder is normal. The spleen demonstrates no focal abnormality.There is a 3 mm proximal right ureteral calculus resulting in mild right hydroureteronephrosis and severe perinephric stranding. There is a small amount of perinephric fluid. There are nonobstructing left renal calculi. The adrenal glands and pancreas are normal. The bladder is unremarkable.  The stomach, duodenum, small intestine, and large intestine demonstrate no contrast extravasation or dilatation. There is diverticulosis without evidence of diverticulitis. There is a left fat containing inguinal hernia. There is no pneumoperitoneum, pneumatosis, or portal venous gas. There is no abdominal or pelvic free fluid. There is no lymphadenopathy.  The abdominal aorta is normal in caliber with atherosclerosis.  There are no lytic or sclerotic  osseous lesions. There is severe degenerative disc disease at L2-3, L3-4, L4-5 and L5-S1. Severe bilateral facet arthropathy at L4-5 and L5-S1.  IMPRESSION: 1. There is a 3 mm proximal right ureteral calculus resulting in mild right hydroureteronephrosis. There is right perinephric stranding and a small amount of perinephric fluid. 2. Nonobstructing left nephrolithiasis.   Electronically Signed   By: Kathreen Devoid   On: 12/24/2013 12:37  I personally reviewed the imaging tests through PACS system I reviewed available ER/hospitalization records through the EMR    EKG Interpretation None      MDM   Final diagnoses:  Right ureteral stone    CT consistent with right ureteral stone.  Pain controlled in the emergency department.  Standard stone precautions given.  Patient feeling better.  Discharge home in good condition.  Outpatient urology followup    Hoy Morn, MD 12/24/13 [phone]

## 2013-12-24 NOTE — ED Notes (Signed)
Patient transported to CT 

## 2013-12-24 NOTE — ED Notes (Signed)
Patient presents to ed c/o pain in his testicle onset 2 am states then the pain moved up into right lower abd. States it felt like a gas bubble tried to walk it off , was able to have small bowel movement states noticed small amt. Mucus in stool. Vomited x 1 this am. Bowels sounds hypoactive. Denies rebound tenderness.

## 2013-12-24 NOTE — ED Notes (Signed)
Pt reports woken up this AM at 230 with right lower groin pain. Pt reports pain has moved to RLQ. Tried eating and drinking this AM but vomited. Unable to pass gas or have bowel movement. Pt noted to be in moderate distress due to pain. Denies fever, diarrhea.

## 2013-12-24 NOTE — ED Notes (Signed)
Drinking contrast for CT.

## 2013-12-25 ENCOUNTER — Encounter (HOSPITAL_COMMUNITY): Payer: Self-pay | Admitting: Emergency Medicine

## 2013-12-25 ENCOUNTER — Emergency Department (HOSPITAL_COMMUNITY)
Admission: EM | Admit: 2013-12-25 | Discharge: 2013-12-26 | Disposition: A | Discharge status: Home / Self Care | Payer: BC Managed Care – PPO | Attending: Emergency Medicine | Admitting: Emergency Medicine

## 2013-12-25 DIAGNOSIS — N1330 Unspecified hydronephrosis: Secondary | ICD-10-CM

## 2013-12-25 DIAGNOSIS — Z880 Allergy status to penicillin: Secondary | ICD-10-CM | POA: Insufficient documentation

## 2013-12-25 DIAGNOSIS — Z792 Long term (current) use of antibiotics: Secondary | ICD-10-CM | POA: Diagnosis not present

## 2013-12-25 DIAGNOSIS — J449 Chronic obstructive pulmonary disease, unspecified: Secondary | ICD-10-CM | POA: Diagnosis not present

## 2013-12-25 DIAGNOSIS — N508 Other specified disorders of male genital organs: Secondary | ICD-10-CM | POA: Insufficient documentation

## 2013-12-25 DIAGNOSIS — R2242 Localized swelling, mass and lump, left lower limb: Secondary | ICD-10-CM | POA: Diagnosis present

## 2013-12-25 DIAGNOSIS — N50811 Right testicular pain: Secondary | ICD-10-CM

## 2013-12-25 DIAGNOSIS — N200 Calculus of kidney: Secondary | ICD-10-CM

## 2013-12-25 DIAGNOSIS — Z87891 Personal history of nicotine dependence: Secondary | ICD-10-CM | POA: Insufficient documentation

## 2013-12-25 DIAGNOSIS — K4091 Unilateral inguinal hernia, without obstruction or gangrene, recurrent: Secondary | ICD-10-CM | POA: Insufficient documentation

## 2013-12-25 DIAGNOSIS — Z7951 Long term (current) use of inhaled steroids: Secondary | ICD-10-CM | POA: Insufficient documentation

## 2013-12-25 DIAGNOSIS — Z9889 Other specified postprocedural states: Secondary | ICD-10-CM | POA: Diagnosis not present

## 2013-12-25 DIAGNOSIS — Z7982 Long term (current) use of aspirin: Secondary | ICD-10-CM | POA: Diagnosis not present

## 2013-12-25 DIAGNOSIS — Z79899 Other long term (current) drug therapy: Secondary | ICD-10-CM | POA: Insufficient documentation

## 2013-12-25 DIAGNOSIS — N179 Acute kidney failure, unspecified: Secondary | ICD-10-CM | POA: Diagnosis not present

## 2013-12-25 DIAGNOSIS — I10 Essential (primary) hypertension: Secondary | ICD-10-CM | POA: Diagnosis not present

## 2013-12-25 DIAGNOSIS — K4090 Unilateral inguinal hernia, without obstruction or gangrene, not specified as recurrent: Secondary | ICD-10-CM

## 2013-12-25 LAB — URINALYSIS, ROUTINE W REFLEX MICROSCOPIC
Bilirubin Urine: NEGATIVE
Glucose, UA: NEGATIVE mg/dL
Ketones, ur: NEGATIVE mg/dL
NITRITE: NEGATIVE
Protein, ur: NEGATIVE mg/dL
SPECIFIC GRAVITY, URINE: 1.003 — AB (ref 1.005–1.030)
UROBILINOGEN UA: 0.2 mg/dL (ref 0.0–1.0)
pH: 5.5 (ref 5.0–8.0)

## 2013-12-25 LAB — URINE MICROSCOPIC-ADD ON

## 2013-12-25 MED ORDER — SODIUM CHLORIDE 0.9 % IV BOLUS (SEPSIS)
500.0000 mL | Freq: Once | INTRAVENOUS | Status: AC
  Administered 2013-12-26: 500 mL via INTRAVENOUS

## 2013-12-25 MED ORDER — HYDROMORPHONE HCL 1 MG/ML IJ SOLN
1.0000 mg | Freq: Once | INTRAMUSCULAR | Status: AC
  Administered 2013-12-26: 1 mg via INTRAVENOUS
  Filled 2013-12-25: qty 1

## 2013-12-25 NOTE — ED Notes (Signed)
Pt reports right flank pain, testicle swelling, and penis swelling. Pt states he was dx with kidney stone two days ago. Pt reports difficulty urinating, and straining his urine with no visible stone.

## 2013-12-26 ENCOUNTER — Emergency Department (HOSPITAL_COMMUNITY): Payer: BC Managed Care – PPO

## 2013-12-26 LAB — BASIC METABOLIC PANEL
Anion gap: 11 (ref 5–15)
BUN: 16 mg/dL (ref 6–23)
CALCIUM: 9.5 mg/dL (ref 8.4–10.5)
CO2: 25 mEq/L (ref 19–32)
Chloride: 98 mEq/L (ref 96–112)
Creatinine, Ser: 1.66 mg/dL — ABNORMAL HIGH (ref 0.50–1.35)
GFR calc Af Amer: 50 mL/min — ABNORMAL LOW (ref >90)
GFR calc non Af Amer: 43 mL/min — ABNORMAL LOW (ref >90)
GLUCOSE: 105 mg/dL — AB (ref 70–99)
Potassium: 4.5 mEq/L (ref 3.7–5.3)
Sodium: 134 mEq/L — ABNORMAL LOW (ref 137–147)

## 2013-12-26 MED ORDER — HYDROMORPHONE HCL 1 MG/ML IJ SOLN: 1.0000 mg | Freq: Once | INTRAMUSCULAR | Status: DC

## 2013-12-26 MED ORDER — CIPROFLOXACIN HCL 500 MG PO TABS: 500.0000 mg | ORAL_TABLET | Freq: Two times a day (BID) | ORAL | Status: DC

## 2013-12-26 NOTE — Discharge Instructions (Signed)
Followup with urology and primary doctor's discussed.  If you were given medicines take as directed.  If you are on coumadin or contraceptives realize their levels and effectiveness is altered by many different medicines.  If you have any reaction (rash, tongues swelling, other) to the medicines stop taking and see a physician.   Please follow up as directed and return to the ER or see a physician for new or worsening symptoms.  Thank you. Filed Vitals:   12/25/13 2330 12/26/13 0000 12/26/13 0014 12/26/13 0030  BP: 145/87 174/92 174/92   Pulse: 101 98 95 102  Temp:      Resp: 31 15 18 18   SpO2: 95% 94% 94% 92%   Avoid ibuprofen or Aleve-type medications until cleared by her doctor for your kidney function.

## 2013-12-26 NOTE — ED Provider Notes (Addendum)
CSN: [id number]     Arrival date & time 12/25/13  2059 History   First MD Initiated Contact with Patient 12/25/13 2303     Chief Complaint  Patient presents with  . Groin Swelling  . Abdominal Pain  . Flank Pain  . Nephrolithiasis     (Consider location/radiation/quality/duration/timing/severity/associated sxs/prior Treatment) HPI Comments: 63 year old male with history of lipids, asthma and recent diagnosis of kidney stone this week 3 mm presents with right lower flank pain radiating to the groin and mild swelling of the groin and right testicle. Patient feels his pain is overall controlled I was concerned for the swelling. No new injuries.  Pain is similar to previous. Patient has urology followup  Patient is a 63 y.o. male presenting with abdominal pain and flank pain. The history is provided by the patient.  Abdominal Pain Associated symptoms: no chest pain, no chills, no dysuria, no fever, no nausea, no shortness of breath and no vomiting   Flank Pain Pertinent negatives include no chest pain, no abdominal pain, no headaches and no shortness of breath.    Past Medical History  Diagnosis Date  . Personal history of colonic adenomas 05/15/2012  . COPD (chronic obstructive pulmonary disease)   . Hypertension   . BPH (benign prostatic hyperplasia)    Past Surgical History  Procedure Laterality Date  . Colonoscopy w/ polypectomy    . Dental implants      upper  . Hemorrhoid surgery    . Leg surgery      fx- right repair plate screw   Family History  Problem Relation Age of Onset  . Ovarian cancer Mother   . Prostate cancer Father   . Colon cancer Neg Hx    History  Substance Use Topics  . Smoking status: Former Research scientist (life sciences)  . Smokeless tobacco: Never Used  . Alcohol Use: No    Review of Systems  Constitutional: Negative for fever and chills.  HENT: Negative for congestion.   Eyes: Negative for visual disturbance.  Respiratory: Negative for shortness of breath.    Cardiovascular: Negative for chest pain.  Gastrointestinal: Negative for nausea, vomiting and abdominal pain.  Genitourinary: Positive for flank pain, scrotal swelling and testicular pain. Negative for dysuria.  Musculoskeletal: Negative for back pain, neck pain and neck stiffness.  Skin: Negative for rash.  Neurological: Negative for light-headedness and headaches.      Allergies  Cephalexin and Penicillins  Home Medications   Prior to Admission medications   Medication Sig Start Date End Date Taking? Authorizing Provider  albuterol (PROVENTIL HFA;VENTOLIN HFA) 108 (90 BASE) MCG/ACT inhaler Inhale 2 puffs into the lungs every 6 (six) hours as needed for wheezing or shortness of breath.   Yes Historical Provider, MD  alfuzosin (UROXATRAL) 10 MG 24 hr tablet Take 10 mg by mouth daily with breakfast.   Yes Historical Provider, MD  Ascorbic Acid (VITAMIN C) 1000 MG tablet Take 1,000 mg by mouth daily.   Yes Historical Provider, MD  aspirin 81 MG tablet Take 81 mg by mouth daily.   Yes Historical Provider, MD  budesonide-formoterol (SYMBICORT) 160-4.5 MCG/ACT inhaler Inhale 2 puffs into the lungs 2 (two) times daily.    Yes Historical Provider, MD  CIALIS 5 MG tablet Take 5 mg by mouth daily as needed for erectile dysfunction.  12/14/13  Yes Historical Provider, MD  ibuprofen (ADVIL,MOTRIN) 600 MG tablet Take 1 tablet (600 mg total) by mouth every 8 (eight) hours as needed. 12/24/13  Yes Metta Clines  Campos, MD  lisinopril (PRINIVIL,ZESTRIL) 20 MG tablet Take 20 mg by mouth daily.   Yes Historical Provider, MD  Multiple Vitamin (MULTIVITAMIN) tablet Take 1 tablet by mouth daily.   Yes Historical Provider, MD  ondansetron (ZOFRAN ODT) 8 MG disintegrating tablet Take 1 tablet (8 mg total) by mouth every 8 (eight) hours as needed for nausea or vomiting. 12/24/13  Yes Hoy Morn, MD  oxyCODONE-acetaminophen (PERCOCET/ROXICET) 5-325 MG per tablet Take 1 tablet by mouth every 4 (four) hours as  needed for severe pain. 12/24/13  Yes Hoy Morn, MD  ciprofloxacin (CIPRO) 500 MG tablet Take 1 tablet (500 mg total) by mouth 2 (two) times daily. One po bid x 7 days 12/26/13   Mariea Clonts, MD   BP 174/92  Pulse 102  Temp(Src) 98.9 F (37.2 C)  Resp 18  SpO2 92% Physical Exam  Nursing note and vitals reviewed. Constitutional: He is oriented to person, place, and time. He appears well-developed and well-nourished.  HENT:  Head: Normocephalic and atraumatic.  Eyes: Conjunctivae are normal. Right eye exhibits no discharge. Left eye exhibits no discharge.  Neck: Normal range of motion. Neck supple. No tracheal deviation present.  Cardiovascular: Normal rate and regular rhythm.   Pulmonary/Chest: Effort normal and breath sounds normal.  Abdominal: Soft. He exhibits no distension. There is tenderness (mild right lower flank and right inguinal region, no hernia appreciated). There is no guarding.  Genitourinary:  Patient is mild testicle swelling and mild tenderness the testicle on the right, no cellulitis or sign of infection, no ecchymosis.  Musculoskeletal: He exhibits no edema.  Neurological: He is alert and oriented to person, place, and time.  Skin: Skin is warm. No rash noted.  Psychiatric: He has a normal mood and affect.    ED Course  Procedures (including critical care time) Emergency Focused Ultrasound Exam Limited retroperitoneal ultrasound of kidneys  Performed and interpreted by Dr. Reather Converse Indication: flank pain Focused abdominal ultrasound with both kidneys imaged in transverse and longitudinal planes in real-time. Interpretation: mild right hydronephrosis visualized.   Images archived electronically  Labs Review Labs Reviewed  URINALYSIS, ROUTINE W REFLEX MICROSCOPIC - Abnormal; Notable for the following:    APPearance CLOUDY (*)    Specific Gravity, Urine 1.003 (*)    Hgb urine dipstick LARGE (*)    Leukocytes, UA TRACE (*)    All other components  within normal limits  BASIC METABOLIC PANEL - Abnormal; Notable for the following:    Sodium 134 (*)    Glucose, Bld 105 (*)    Creatinine, Ser 1.66 (*)    GFR calc non Af Amer 43 (*)    GFR calc Af Amer 50 (*)    All other components within normal limits  URINE MICROSCOPIC-ADD ON    Imaging Review US Scrotum  12/26/2013   CLINICAL DATA:  Right-sided testicular pain.  EXAM: SCROTAL ULTRASOUND  DOPPLER ULTRASOUND OF THE TESTICLES  TECHNIQUE: Complete ultrasound examination of the testicles, epididymis, and other scrotal structures was performed. Color and spectral Doppler ultrasound were also utilized to evaluate blood flow to the testicles.  COMPARISON:  CT abdomen and pelvis 12/24/2013  FINDINGS: Right testicle  Measurements: 3.2 x 1.9 x 2 cm. No mass or microlithiasis visualized.  Left testicle  Measurements: 3.5 x 2.1 x 2.2 cm. No mass or microlithiasis visualized.  Right epididymis:  Normal in size and appearance.  Left epididymis:  Normal in size and appearance.  Hydrocele:  None visualized.  Varicocele:  None visualized.  Pulsed Doppler interrogation of both testes demonstrates low resistance arterial and venous waveforms bilaterally. Normal symmetrical flow is demonstrated to both testes and epididymides on color flow Doppler imaging.  There is bilateral scrotal skin thickening suggesting edema or cellulitis.  There is a a right inguinal hernia containing fat tissues.  IMPRESSION: Normal ultrasound appearance of the testes. No evidence of mass or torsion. Nonspecific scrotal skin thickening suggesting edema or cellulitis. Right inguinal hernia containing fat.   Electronically Signed   By: Lucienne Capers M.D.   On: 12/26/2013 01:43   Ct Abdomen Pelvis W Contrast  12/24/2013   CLINICAL DATA:  Right lower groin pain in right lower quadrant.  EXAM: CT ABDOMEN AND PELVIS WITH CONTRAST  TECHNIQUE: Multidetector CT imaging of the abdomen and pelvis was performed using the standard protocol  following bolus administration of intravenous contrast.  CONTRAST:  163mL OMNIPAQUE IOHEXOL 300 MG/ML  SOLN  COMPARISON:  None.  FINDINGS: The lung bases are clear.  The liver demonstrates no focal abnormality. There is no intrahepatic or extrahepatic biliary ductal dilatation. The gallbladder is normal. The spleen demonstrates no focal abnormality.There is a 3 mm proximal right ureteral calculus resulting in mild right hydroureteronephrosis and severe perinephric stranding. There is a small amount of perinephric fluid. There are nonobstructing left renal calculi. The adrenal glands and pancreas are normal. The bladder is unremarkable.  The stomach, duodenum, small intestine, and large intestine demonstrate no contrast extravasation or dilatation. There is diverticulosis without evidence of diverticulitis. There is a left fat containing inguinal hernia. There is no pneumoperitoneum, pneumatosis, or portal venous gas. There is no abdominal or pelvic free fluid. There is no lymphadenopathy.  The abdominal aorta is normal in caliber with atherosclerosis.  There are no lytic or sclerotic osseous lesions. There is severe degenerative disc disease at L2-3, L3-4, L4-5 and L5-S1. Severe bilateral facet arthropathy at L4-5 and L5-S1.  IMPRESSION: 1. There is a 3 mm proximal right ureteral calculus resulting in mild right hydroureteronephrosis. There is right perinephric stranding and a small amount of perinephric fluid. 2. Nonobstructing left nephrolithiasis.   Electronically Signed   By: Kathreen Devoid   On: 12/24/2013 12:37   [place] Art/ven Flow Abd Pelv Doppler  12/26/2013   CLINICAL DATA:  Right-sided testicular pain.  EXAM: SCROTAL ULTRASOUND  DOPPLER ULTRASOUND OF THE TESTICLES  TECHNIQUE: Complete ultrasound examination of the testicles, epididymis, and other scrotal structures was performed. Color and spectral Doppler ultrasound were also utilized to evaluate blood flow to the testicles.  COMPARISON:  CT abdomen and  pelvis 12/24/2013  FINDINGS: Right testicle  Measurements: 3.2 x 1.9 x 2 cm. No mass or microlithiasis visualized.  Left testicle  Measurements: 3.5 x 2.1 x 2.2 cm. No mass or microlithiasis visualized.  Right epididymis:  Normal in size and appearance.  Left epididymis:  Normal in size and appearance.  Hydrocele:  None visualized.  Varicocele:  None visualized.  Pulsed Doppler interrogation of both testes demonstrates low resistance arterial and venous waveforms bilaterally. Normal symmetrical flow is demonstrated to both testes and epididymides on color flow Doppler imaging.  There is bilateral scrotal skin thickening suggesting edema or cellulitis.  There is a a right inguinal hernia containing fat tissues.  IMPRESSION: Normal ultrasound appearance of the testes. No evidence of mass or torsion. Nonspecific scrotal skin thickening suggesting edema or cellulitis. Right inguinal hernia containing fat.   Electronically Signed   By: Lucienne Capers M.D.   On: 12/26/2013 01:43  EKG Interpretation None      MDM   Final diagnoses:  Testicular pain, right  Right kidney stone  Hydronephrosis of right kidney  Acute renal failure, unspecified acute renal failure type  Right inguinal hernia   Patient with clinically kidney stone, patient had CT scan recently showing 3 mm stone and has urology followup. Mild testicle swelling and mild pain. Plan for ultrasound to look for other causes to confirm blood flow. Pain medicines given.  Pain improved in the ER, acute renal failure needs close to patient followup. Ultrasound testicles no acute findings. Repeat pain medicines given prior to discharge. Discussed small hernia fat-containing. Discussed Cipro for possible early epididymitis until he sees urologist. Results and differential diagnosis were discussed with the patient/parent/guardian. Close follow up outpatient was discussed, comfortable with the plan.   Medications  HYDROmorphone (DILAUDID)  injection 1 mg (not administered)  HYDROmorphone (DILAUDID) injection 1 mg (1 mg Intravenous Given 12/26/13 0030)  sodium chloride 0.9 % bolus 500 mL (0 mLs Intravenous Stopped 12/26/13 0155)    Filed Vitals:   12/25/13 2330 12/26/13 0000 12/26/13 0014 12/26/13 0030  BP: 145/87 174/92 174/92   Pulse: 101 98 95 102  Temp:      Resp: 31 15 18 18   SpO2: 95% 94% 94% 92%    Final diagnoses:  Testicular pain, right  Right kidney stone  Hydronephrosis of right kidney  Acute renal failure, unspecified acute renal failure type  Right inguinal hernia        Mariea Clonts, MD 12/26/13 [id number]  Mariea Clonts, MD 12/26/13 [phone]

## 2014-09-21 ENCOUNTER — Encounter: Payer: Self-pay | Admitting: Internal Medicine

## 2015-02-04 ENCOUNTER — Encounter: Payer: Self-pay | Admitting: Internal Medicine

## 2015-04-01 ENCOUNTER — Other Ambulatory Visit: Payer: Self-pay | Admitting: General Surgery

## 2015-04-07 ENCOUNTER — Ambulatory Visit: Payer: Self-pay | Admitting: Internal Medicine

## 2016-02-29 DIAGNOSIS — J449 Chronic obstructive pulmonary disease, unspecified: Secondary | ICD-10-CM | POA: Insufficient documentation

## 2016-02-29 DIAGNOSIS — I10 Essential (primary) hypertension: Secondary | ICD-10-CM | POA: Insufficient documentation

## 2017-06-27 ENCOUNTER — Encounter: Payer: Self-pay | Admitting: Internal Medicine

## 2017-07-11 DIAGNOSIS — L821 Other seborrheic keratosis: Secondary | ICD-10-CM | POA: Diagnosis not present

## 2017-07-11 DIAGNOSIS — L570 Actinic keratosis: Secondary | ICD-10-CM | POA: Diagnosis not present

## 2017-07-11 DIAGNOSIS — Z85828 Personal history of other malignant neoplasm of skin: Secondary | ICD-10-CM | POA: Diagnosis not present

## 2017-07-11 DIAGNOSIS — B360 Pityriasis versicolor: Secondary | ICD-10-CM | POA: Diagnosis not present

## 2017-07-11 DIAGNOSIS — B070 Plantar wart: Secondary | ICD-10-CM | POA: Diagnosis not present

## 2017-07-11 DIAGNOSIS — L820 Inflamed seborrheic keratosis: Secondary | ICD-10-CM | POA: Diagnosis not present

## 2018-05-05 DIAGNOSIS — Z1159 Encounter for screening for other viral diseases: Secondary | ICD-10-CM | POA: Diagnosis not present

## 2018-05-05 DIAGNOSIS — Z0000 Encounter for general adult medical examination without abnormal findings: Secondary | ICD-10-CM | POA: Diagnosis not present

## 2018-05-05 DIAGNOSIS — I10 Essential (primary) hypertension: Secondary | ICD-10-CM | POA: Diagnosis not present

## 2018-05-05 DIAGNOSIS — Z125 Encounter for screening for malignant neoplasm of prostate: Secondary | ICD-10-CM | POA: Diagnosis not present

## 2018-05-13 DIAGNOSIS — R768 Other specified abnormal immunological findings in serum: Secondary | ICD-10-CM | POA: Diagnosis not present

## 2018-06-25 DIAGNOSIS — B1920 Unspecified viral hepatitis C without hepatic coma: Secondary | ICD-10-CM | POA: Diagnosis not present

## 2018-06-25 DIAGNOSIS — Z86010 Personal history of colonic polyps: Secondary | ICD-10-CM | POA: Diagnosis not present

## 2018-07-15 DIAGNOSIS — L821 Other seborrheic keratosis: Secondary | ICD-10-CM | POA: Diagnosis not present

## 2018-07-15 DIAGNOSIS — L814 Other melanin hyperpigmentation: Secondary | ICD-10-CM | POA: Diagnosis not present

## 2018-07-15 DIAGNOSIS — L820 Inflamed seborrheic keratosis: Secondary | ICD-10-CM | POA: Diagnosis not present

## 2018-07-15 DIAGNOSIS — Z85828 Personal history of other malignant neoplasm of skin: Secondary | ICD-10-CM | POA: Diagnosis not present

## 2018-07-15 DIAGNOSIS — S70362A Insect bite (nonvenomous), left thigh, initial encounter: Secondary | ICD-10-CM | POA: Diagnosis not present

## 2018-07-15 DIAGNOSIS — D1801 Hemangioma of skin and subcutaneous tissue: Secondary | ICD-10-CM | POA: Diagnosis not present

## 2018-07-15 DIAGNOSIS — D2262 Melanocytic nevi of left upper limb, including shoulder: Secondary | ICD-10-CM | POA: Diagnosis not present

## 2018-07-15 DIAGNOSIS — D225 Melanocytic nevi of trunk: Secondary | ICD-10-CM | POA: Diagnosis not present

## 2018-08-14 ENCOUNTER — Other Ambulatory Visit: Payer: Self-pay | Admitting: Gastroenterology

## 2018-08-14 DIAGNOSIS — B1920 Unspecified viral hepatitis C without hepatic coma: Secondary | ICD-10-CM

## 2018-08-29 ENCOUNTER — Ambulatory Visit
Admission: RE | Admit: 2018-08-29 | Discharge: 2018-08-29 | Disposition: A | Discharge status: Home / Self Care | Payer: PPO | Source: Ambulatory Visit | Attending: Gastroenterology | Admitting: Gastroenterology

## 2018-08-29 DIAGNOSIS — R161 Splenomegaly, not elsewhere classified: Secondary | ICD-10-CM | POA: Diagnosis not present

## 2018-08-29 DIAGNOSIS — B1920 Unspecified viral hepatitis C without hepatic coma: Secondary | ICD-10-CM

## 2018-10-14 DIAGNOSIS — B182 Chronic viral hepatitis C: Secondary | ICD-10-CM | POA: Diagnosis not present

## 2019-01-12 DIAGNOSIS — H00011 Hordeolum externum right upper eyelid: Secondary | ICD-10-CM | POA: Diagnosis not present

## 2019-01-12 DIAGNOSIS — Z85828 Personal history of other malignant neoplasm of skin: Secondary | ICD-10-CM | POA: Diagnosis not present

## 2019-01-12 DIAGNOSIS — C4441 Basal cell carcinoma of skin of scalp and neck: Secondary | ICD-10-CM | POA: Diagnosis not present

## 2019-01-12 DIAGNOSIS — L570 Actinic keratosis: Secondary | ICD-10-CM | POA: Diagnosis not present

## 2019-01-12 DIAGNOSIS — L820 Inflamed seborrheic keratosis: Secondary | ICD-10-CM | POA: Diagnosis not present

## 2019-01-12 DIAGNOSIS — L821 Other seborrheic keratosis: Secondary | ICD-10-CM | POA: Diagnosis not present

## 2019-01-13 DIAGNOSIS — Z86010 Personal history of colonic polyps: Secondary | ICD-10-CM | POA: Diagnosis not present

## 2019-01-13 DIAGNOSIS — B1920 Unspecified viral hepatitis C without hepatic coma: Secondary | ICD-10-CM | POA: Diagnosis not present

## 2019-01-15 ENCOUNTER — Other Ambulatory Visit: Payer: Self-pay | Admitting: Gastroenterology

## 2019-01-15 DIAGNOSIS — R768 Other specified abnormal immunological findings in serum: Secondary | ICD-10-CM

## 2019-01-21 ENCOUNTER — Ambulatory Visit
Admission: RE | Admit: 2019-01-21 | Discharge: 2019-01-21 | Disposition: A | Discharge status: Home / Self Care | Payer: PPO | Source: Ambulatory Visit | Attending: Gastroenterology | Admitting: Gastroenterology

## 2019-01-21 DIAGNOSIS — R768 Other specified abnormal immunological findings in serum: Secondary | ICD-10-CM

## 2019-01-21 DIAGNOSIS — B1920 Unspecified viral hepatitis C without hepatic coma: Secondary | ICD-10-CM | POA: Diagnosis not present

## 2019-01-27 DIAGNOSIS — C4441 Basal cell carcinoma of skin of scalp and neck: Secondary | ICD-10-CM | POA: Diagnosis not present

## 2019-01-27 DIAGNOSIS — Z85828 Personal history of other malignant neoplasm of skin: Secondary | ICD-10-CM | POA: Diagnosis not present

## 2019-01-27 DIAGNOSIS — L820 Inflamed seborrheic keratosis: Secondary | ICD-10-CM | POA: Diagnosis not present

## 2019-02-16 DIAGNOSIS — Z1159 Encounter for screening for other viral diseases: Secondary | ICD-10-CM | POA: Diagnosis not present

## 2019-02-19 DIAGNOSIS — K648 Other hemorrhoids: Secondary | ICD-10-CM | POA: Diagnosis not present

## 2019-02-19 DIAGNOSIS — K5730 Diverticulosis of large intestine without perforation or abscess without bleeding: Secondary | ICD-10-CM | POA: Diagnosis not present

## 2019-02-19 DIAGNOSIS — K2970 Gastritis, unspecified, without bleeding: Secondary | ICD-10-CM | POA: Diagnosis not present

## 2019-02-19 DIAGNOSIS — Z86010 Personal history of colonic polyps: Secondary | ICD-10-CM | POA: Diagnosis not present

## 2019-04-03 ENCOUNTER — Ambulatory Visit: Payer: PPO

## 2019-04-11 ENCOUNTER — Ambulatory Visit: Payer: PPO | Attending: Internal Medicine

## 2019-04-11 DIAGNOSIS — Z23 Encounter for immunization: Secondary | ICD-10-CM | POA: Insufficient documentation

## 2019-04-11 NOTE — Progress Notes (Signed)
   Covid-19 Vaccination Clinic  Name:  EBERARDO KALLMEYER    MRN: [id number] DOB: 1950-07-04  04/11/2019  Mr. Dobbyn was observed post Covid-19 immunization for 15 minutes without incidence. He was provided with Vaccine Information Sheet and instruction to access the V-Safe system.   Mr. Schweitzer was instructed to call 911 with any severe reactions post vaccine: Marland Kitchen Difficulty breathing  . Swelling of your face and throat  . A fast heartbeat  . A bad rash all over your body  . Dizziness and weakness    Immunizations Administered    Name Date Dose VIS Date Route   Pfizer COVID-19 Vaccine 04/11/2019  3:23 PM 0.3 mL 02/13/2019 Intramuscular   Manufacturer: [place]   Lot: [id number]   [place]: [id number]

## 2019-04-14 ENCOUNTER — Ambulatory Visit: Payer: PPO

## 2019-05-05 ENCOUNTER — Ambulatory Visit: Payer: PPO | Attending: Internal Medicine

## 2019-05-05 DIAGNOSIS — Z23 Encounter for immunization: Secondary | ICD-10-CM | POA: Insufficient documentation

## 2019-05-05 NOTE — Progress Notes (Signed)
   Covid-19 Vaccination Clinic  Name:  Benjamin [name]    MRN: [id number] DOB: Mar 20, 1950  05/05/2019  Benjamin [name] was observed post Covid-19 immunization for 15 minutes without incident. He was provided with Vaccine Information Sheet and instruction to access the V-Safe system.   Benjamin [name] was instructed to call 911 with any severe reactions post vaccine: Marland Kitchen Difficulty breathing  . Swelling of face and throat  . A fast heartbeat  . A bad rash all over body  . Dizziness and weakness   Immunizations Administered    Name Date Dose VIS Date Route   Pfizer COVID-19 Vaccine 05/05/2019 11:45 AM 0.3 mL 02/13/2019 Intramuscular   Manufacturer: [place]   Lot: [id number]   [place]: [id number]

## 2019-05-06 ENCOUNTER — Ambulatory Visit: Payer: PPO

## 2019-06-17 DIAGNOSIS — J449 Chronic obstructive pulmonary disease, unspecified: Secondary | ICD-10-CM | POA: Diagnosis not present

## 2019-06-17 DIAGNOSIS — B182 Chronic viral hepatitis C: Secondary | ICD-10-CM | POA: Diagnosis not present

## 2019-06-17 DIAGNOSIS — B1920 Unspecified viral hepatitis C without hepatic coma: Secondary | ICD-10-CM | POA: Diagnosis not present

## 2019-06-17 DIAGNOSIS — I10 Essential (primary) hypertension: Secondary | ICD-10-CM | POA: Diagnosis not present

## 2019-06-17 DIAGNOSIS — Z0000 Encounter for general adult medical examination without abnormal findings: Secondary | ICD-10-CM | POA: Diagnosis not present

## 2019-06-17 DIAGNOSIS — N529 Male erectile dysfunction, unspecified: Secondary | ICD-10-CM | POA: Diagnosis not present

## 2019-06-25 ENCOUNTER — Ambulatory Visit: Payer: PPO | Attending: Internal Medicine

## 2019-06-25 DIAGNOSIS — Z20822 Contact with and (suspected) exposure to covid-19: Secondary | ICD-10-CM

## 2019-06-26 LAB — SARS-COV-2, NAA 2 DAY TAT

## 2019-06-26 LAB — NOVEL CORONAVIRUS, NAA: SARS-CoV-2, NAA: NOT DETECTED

## 2019-07-07 ENCOUNTER — Other Ambulatory Visit: Payer: Self-pay | Admitting: Gastroenterology

## 2019-07-07 DIAGNOSIS — B1920 Unspecified viral hepatitis C without hepatic coma: Secondary | ICD-10-CM

## 2019-07-13 ENCOUNTER — Other Ambulatory Visit: Payer: Self-pay

## 2019-07-13 ENCOUNTER — Encounter: Payer: Self-pay | Admitting: Pulmonary Disease

## 2019-07-13 ENCOUNTER — Ambulatory Visit: Payer: PPO | Admitting: Pulmonary Disease

## 2019-07-13 VITALS — BP 112/80 | HR 72 | Temp 98.3°F | Ht 70.5 in | Wt 217.0 lb

## 2019-07-13 DIAGNOSIS — J449 Chronic obstructive pulmonary disease, unspecified: Secondary | ICD-10-CM

## 2019-07-13 DIAGNOSIS — Z87891 Personal history of nicotine dependence: Secondary | ICD-10-CM

## 2019-07-13 DIAGNOSIS — R0602 Shortness of breath: Secondary | ICD-10-CM

## 2019-07-13 MED ORDER — TRELEGY ELLIPTA 100-62.5-25 MCG/INH IN AEPB: 1.0000 | INHALATION_SPRAY | Freq: Every day | RESPIRATORY_TRACT | 5 refills | Status: DC

## 2019-07-13 MED ORDER — ALBUTEROL SULFATE (2.5 MG/3ML) 0.083% IN NEBU: 2.5000 mg | INHALATION_SOLUTION | RESPIRATORY_TRACT | 12 refills | Status: AC | PRN

## 2019-07-13 MED ORDER — TRELEGY ELLIPTA 100-62.5-25 MCG/INH IN AEPB: 1.0000 | INHALATION_SPRAY | Freq: Every day | RESPIRATORY_TRACT | 0 refills | Status: DC

## 2019-07-13 NOTE — Addendum Note (Signed)
Addended by: Lorretta Harp on: 07/13/2019 05:09 PM   Modules accepted: Orders

## 2019-07-13 NOTE — Patient Instructions (Addendum)
Thank you for visiting Dr. Valeta Harms at [facility]. Today we recommend the following:  Orders Placed This Encounter  Procedures  . Pulmonary Function Test   Trelegy inhaler samples today  New prescription for this Keep vitamin d supplement going   Return in about 4 weeks (around 08/10/2019).    Please do your part to reduce the spread of COVID-19.

## 2019-07-13 NOTE — Progress Notes (Signed)
Synopsis: Referred in May 2021 for COPD by Aretta Nip, MD  Subjective:   PATIENT ID: Benjamin [name] GENDER: male DOB: 1950/08/12, MRN: [id number]  Chief Complaint  Patient presents with  . Consult    Pt is being referred by Dr. Radene Ou due to worsening COPD with mild O2 desats.  Pt states he becomes SOB with exertion.     This is a 69 year old gentleman former tobacco abuse history quit smoking when he was 89 smoked from the age 64 for 30 years greater than a pack a day.  Diagnosis COPD in the past.  Has been managed for the past 10 years on Symbicort and as needed albuterol.  He tries to stay active however is limited due to his dyspnea at this point.  Additional medical history includes hypertension BPH.  Lives with his wife.  Recently had first grandchild.  Daughter is a travel Marine scientist in [place].  He recently traveled there to visit.  Also traveled to [facility] to visit son and new grandchild.  Both him and his wife have received their Covid vaccine.  Overall he has had progressive shortness of breath for the past several weeks.  He the pollen outside feels like it is more difficult for him to breathe.  He does have occasional wheezing.  He does not have a nebulizer at home.   Past Medical History:  Diagnosis Date  . BPH (benign prostatic hyperplasia)   . COPD (chronic obstructive pulmonary disease) ([place])   . Diverticulosis   . ED (erectile dysfunction)   . External hemorrhoid   . Hypertension   . Internal hemorrhoid   . Kidney stone   . Personal history of colonic adenomas 05/15/2012     Family History  Problem Relation Age of Onset  . Ovarian cancer Mother   . Prostate cancer Father   . Colon cancer Neg Hx   . Hypertension Father   . Thyroid disease Mother   . Leukemia Mother   . Bone cancer Mother      Past Surgical History:  Procedure Laterality Date  . COLONOSCOPY W/ POLYPECTOMY    . dental implants     upper  . HEMORRHOID SURGERY    . LEG SURGERY  Right    fx- right repair plate screw    Social History   Socioeconomic History  . Marital status: Married    Spouse name: Not on file  . Number of children: Not on file  . Years of education: Not on file  . Highest education level: Not on file  Occupational History  . Not on file  Tobacco Use  . Smoking status: Former Smoker    Packs/day: 2.00    Years: 30.00    Pack years: 60.00    Types: Cigarettes    Quit date: 1999    Years since quitting: 22.3  . Smokeless tobacco: Never Used  Substance and Sexual Activity  . Alcohol use: No    Alcohol/week: 0.0 standard drinks  . Drug use: Yes    Types: Marijuana  . Sexual activity: Not on file  Other Topics Concern  . Not on file  Social History Narrative  . Not on file   Social Determinants of Health   Financial Resource Strain:   . Difficulty of Paying Living Expenses:   Food Insecurity:   . Worried About Charity fundraiser in the Last Year:   . Arboriculturist in the Last Year:   [employer]  Needs:   . Lack of Transportation (Medical):   Marland Kitchen Lack of Transportation (Non-Medical):   Physical Activity:   . Days of Exercise per Week:   . Minutes of Exercise per Session:   Stress:   . Feeling of Stress :   Social Connections:   . Frequency of Communication with Friends and Family:   . Frequency of Social Gatherings with Friends and Family:   . Attends Religious Services:   . Active Member of Clubs or Organizations:   . Attends Archivist Meetings:   Marland Kitchen Marital Status:   Intimate Partner Violence:   . Fear of Current or Ex-Partner:   . Emotionally Abused:   Marland Kitchen Physically Abused:   . Sexually Abused:      Allergies  Allergen Reactions  . Keflex [Cephalexin] Rash  . Penicillins Rash     Outpatient Medications Prior to Visit  Medication Sig Dispense Refill  . albuterol (PROVENTIL HFA;VENTOLIN HFA) 108 (90 BASE) MCG/ACT inhaler Inhale 2 puffs into the lungs every 6 (six) hours as needed for wheezing or  shortness of breath.    . alfuzosin (UROXATRAL) 10 MG 24 hr tablet Take 10 mg by mouth daily with breakfast.    . Ascorbic Acid (VITAMIN C) 1000 MG tablet Take 1,000 mg by mouth daily.    Marland Kitchen aspirin 81 MG tablet Take 81 mg by mouth daily.    . budesonide-formoterol (SYMBICORT) 160-4.5 MCG/ACT inhaler Inhale 2 puffs into the lungs 2 (two) times daily.     Marland Kitchen ibuprofen (ADVIL,MOTRIN) 600 MG tablet Take 1 tablet (600 mg total) by mouth every 8 (eight) hours as needed. 15 tablet 0  . lisinopril (PRINIVIL,ZESTRIL) 20 MG tablet Take 20 mg by mouth daily.    . Multiple Vitamin (MULTIVITAMIN) tablet Take 1 tablet by mouth daily.    Marland Kitchen CIALIS 5 MG tablet Take 5 mg by mouth daily as needed for erectile dysfunction.     . ciprofloxacin (CIPRO) 500 MG tablet Take 1 tablet (500 mg total) by mouth 2 (two) times daily. One po bid x 7 days 14 tablet 0  . ondansetron (ZOFRAN ODT) 8 MG disintegrating tablet Take 1 tablet (8 mg total) by mouth every 8 (eight) hours as needed for nausea or vomiting. 12 tablet 0  . oxyCODONE-acetaminophen (PERCOCET/ROXICET) 5-325 MG per tablet Take 1 tablet by mouth every 4 (four) hours as needed for severe pain. 20 tablet 0   No facility-administered medications prior to visit.    Review of Systems  Constitutional: Negative for chills, fever, malaise/fatigue and weight loss.  HENT: Negative for hearing loss, sore throat and tinnitus.   Eyes: Negative for blurred vision and double vision.  Respiratory: Positive for cough and shortness of breath. Negative for hemoptysis, sputum production, wheezing and stridor.   Cardiovascular: Negative for chest pain, palpitations, orthopnea, leg swelling and PND.  Gastrointestinal: Negative for abdominal pain, constipation, diarrhea, heartburn, nausea and vomiting.  Genitourinary: Negative for dysuria, hematuria and urgency.  Musculoskeletal: Negative for joint pain and myalgias.  Skin: Negative for itching and rash.  Neurological: Negative for  dizziness, tingling, weakness and headaches.  Endo/Heme/Allergies: Negative for environmental allergies. Does not bruise/bleed easily.  Psychiatric/Behavioral: Negative for depression. The patient is not nervous/anxious and does not have insomnia.   All other systems reviewed and are negative.    Objective:  Physical Exam Vitals reviewed.  Constitutional:      General: He is not in acute distress.    Appearance: He is well-developed. He  is obese.  HENT:     Head: Normocephalic and atraumatic.     Mouth/Throat:     Pharynx: No oropharyngeal exudate.  Eyes:     Conjunctiva/sclera: Conjunctivae normal.     Pupils: Pupils are equal, round, and reactive to light.  Neck:     Vascular: No JVD.     Trachea: No tracheal deviation.     Comments: Loss of supraclavicular fat Cardiovascular:     Rate and Rhythm: Normal rate and regular rhythm.     Heart sounds: S1 normal and S2 normal.     Comments: Distant heart tones Pulmonary:     Effort: No tachypnea or accessory muscle usage.     Breath sounds: No stridor. Decreased breath sounds (throughout all lung fields) present. No wheezing, rhonchi or rales.  Abdominal:     General: Bowel sounds are normal. There is no distension.     Palpations: Abdomen is soft.     Tenderness: There is no abdominal tenderness.  Musculoskeletal:        General: No deformity (muscle wasting ).  Skin:    General: Skin is warm and dry.     Capillary Refill: Capillary refill takes less than 2 seconds.     Findings: No rash.  Neurological:     Mental Status: He is alert and oriented to person, place, and time.  Psychiatric:        Behavior: Behavior normal.      Vitals:   07/13/19 1605 07/13/19 1616  BP:  112/80  Pulse: 92 72  Temp:  98.3 F (36.8 C)  TempSrc:  Temporal  SpO2: (!) 88% 96%  Weight: 217 lb (98.4 kg)   Height: 5' 10.5" (1.791 m)    96% on RA BMI Readings from Last 3 Encounters:  07/13/19 30.70 kg/m  07/15/12 28.29 kg/m   07/01/12 28.35 kg/m   Wt Readings from Last 3 Encounters:  07/13/19 217 lb (98.4 kg)  07/15/12 200 lb (90.7 kg)  07/01/12 200 lb 6.4 oz (90.9 kg)     CBC    Component Value Date/Time   WBC 9.9 12/24/2013 0951   RBC 4.83 12/24/2013 0951   HGB 14.8 12/24/2013 0951   HCT 43.1 12/24/2013 0951   PLT 211 12/24/2013 0951   MCV 89.2 12/24/2013 0951   MCH 30.6 12/24/2013 0951   MCHC 34.3 12/24/2013 0951   RDW 12.7 12/24/2013 0951   LYMPHSABS 1.1 12/24/2013 0951   MONOABS 0.3 12/24/2013 0951   EOSABS 0.0 12/24/2013 0951   BASOSABS 0.0 12/24/2013 0951    Chest Imaging: No recent imaging   Pulmonary Functions Testing Results: No flowsheet data found.  FeNO: none   Pathology: none  Echocardiogram: none  Heart Catheterization: none     Assessment & Plan:     ICD-10-CM   1. SOB (shortness of breath)  R06.02 Pulmonary Function Test  2. Former smoker  Z87.891   3. Chronic obstructive pulmonary disease, unspecified COPD type ([place])  J44.9     Discussion: This is a 69 year old obese gentleman, former smoker, 30+ pack year history.  Labeled with COPD in the past.  No prior PFTs.  Plan: Ordered full PFTs to be completed prior to next office visit. Started patient on Trelegy inhaler.  Continue vitamin D supplement. Return to clinic in 4 weeks after PFTs complete.   Current Outpatient Medications:  .  albuterol (PROVENTIL HFA;VENTOLIN HFA) 108 (90 BASE) MCG/ACT inhaler, Inhale 2 puffs into the lungs every 6 (six) hours  as needed for wheezing or shortness of breath., Disp: , Rfl:  .  alfuzosin (UROXATRAL) 10 MG 24 hr tablet, Take 10 mg by mouth daily with breakfast., Disp: , Rfl:  .  Ascorbic Acid (VITAMIN C) 1000 MG tablet, Take 1,000 mg by mouth daily., Disp: , Rfl:  .  aspirin 81 MG tablet, Take 81 mg by mouth daily., Disp: , Rfl:  .  budesonide-formoterol (SYMBICORT) 160-4.5 MCG/ACT inhaler, Inhale 2 puffs into the lungs 2 (two) times daily. , Disp: , Rfl:  .  ibuprofen  (ADVIL,MOTRIN) 600 MG tablet, Take 1 tablet (600 mg total) by mouth every 8 (eight) hours as needed., Disp: 15 tablet, Rfl: 0 .  lisinopril (PRINIVIL,ZESTRIL) 20 MG tablet, Take 20 mg by mouth daily., Disp: , Rfl:  .  Multiple Vitamin (MULTIVITAMIN) tablet, Take 1 tablet by mouth daily., Disp: , Rfl:  .  CIALIS 5 MG tablet, Take 5 mg by mouth daily as needed for erectile dysfunction. , Disp: , Rfl:    Garner Nash, DO [facility] 07/13/2019 4:25 PM

## 2019-07-16 DIAGNOSIS — J449 Chronic obstructive pulmonary disease, unspecified: Secondary | ICD-10-CM | POA: Diagnosis not present

## 2019-07-22 ENCOUNTER — Ambulatory Visit
Admission: RE | Admit: 2019-07-22 | Discharge: 2019-07-22 | Disposition: A | Discharge status: Home / Self Care | Payer: PPO | Source: Ambulatory Visit | Attending: Gastroenterology | Admitting: Gastroenterology

## 2019-07-22 DIAGNOSIS — B1920 Unspecified viral hepatitis C without hepatic coma: Secondary | ICD-10-CM

## 2019-07-30 DIAGNOSIS — L821 Other seborrheic keratosis: Secondary | ICD-10-CM | POA: Diagnosis not present

## 2019-07-30 DIAGNOSIS — B070 Plantar wart: Secondary | ICD-10-CM | POA: Diagnosis not present

## 2019-07-30 DIAGNOSIS — Z85828 Personal history of other malignant neoplasm of skin: Secondary | ICD-10-CM | POA: Diagnosis not present

## 2019-07-30 DIAGNOSIS — D225 Melanocytic nevi of trunk: Secondary | ICD-10-CM | POA: Diagnosis not present

## 2019-07-30 DIAGNOSIS — L570 Actinic keratosis: Secondary | ICD-10-CM | POA: Diagnosis not present

## 2019-07-30 DIAGNOSIS — L814 Other melanin hyperpigmentation: Secondary | ICD-10-CM | POA: Diagnosis not present

## 2019-08-11 ENCOUNTER — Ambulatory Visit: Payer: PPO | Admitting: Adult Health

## 2019-08-11 ENCOUNTER — Encounter: Payer: Self-pay | Admitting: Adult Health

## 2019-08-11 ENCOUNTER — Other Ambulatory Visit: Payer: Self-pay

## 2019-08-11 ENCOUNTER — Ambulatory Visit (INDEPENDENT_AMBULATORY_CARE_PROVIDER_SITE_OTHER): Payer: PPO

## 2019-08-11 VITALS — BP 122/72 | HR 92 | Temp 97.6°F | Ht 70.5 in | Wt 214.6 lb

## 2019-08-11 DIAGNOSIS — J449 Chronic obstructive pulmonary disease, unspecified: Secondary | ICD-10-CM | POA: Diagnosis not present

## 2019-08-11 DIAGNOSIS — R0602 Shortness of breath: Secondary | ICD-10-CM | POA: Diagnosis not present

## 2019-08-11 DIAGNOSIS — J439 Emphysema, unspecified: Secondary | ICD-10-CM | POA: Diagnosis not present

## 2019-08-11 NOTE — Progress Notes (Signed)
PCCM: Thanks for seeing them  Garner Nash, DO [facility] 08/11/2019 5:00 PM

## 2019-08-11 NOTE — Progress Notes (Signed)
@Patient  ID: Benjamin [name], male    DOB: 05/18/1950, 69 y.o.   MRN: [id number]  Chief Complaint  Patient presents with  . Follow-up    COPD     Referring provider: Aretta Nip, MD  HPI: 70 year old male former smoker seen for pulmonary consult to establish for COPD   TEST/EVENTS :   08/11/2019 Follow up : COPD  Patient returns for a 1 month follow-up.  Patient was seen last visit for a pulmonary consult to establish for COPD.  Patient carries a diagnosis of COPD but has had no previous pulmonary function testing.  He has previously been on Symbicort but noticed that breathing was not doing as well.  Last visit he was changed to Trelegy.  Recommend use albuterol inhaler or nebulizers as needed.  Patient says he is feeling better.  Feels that his breathing is more controlled.  He has less shortness of breath and cough.  Patient says he is very active works in the garden does house chores and travels quite a bit.  He still works as a Clinical biochemist.  Covid vaccines are up-to-date x2. Patient denies any chest pain orthopnea PND or increased leg swelling.  No hemoptysis.   Allergies  Allergen Reactions  . Keflex [Cephalexin] Rash  . Penicillins Rash    Immunization History  Administered Date(s) Administered  . Influenza, Quadrivalent, Recombinant, Inj, Pf 12/15/2018  . Influenza-Unspecified 01/05/2015  . PFIZER SARS-COV-2 Vaccination 04/11/2019, 05/05/2019  . Pneumococcal Conjugate-13 02/02/2015  . Pneumococcal-Unspecified 09/26/2010  . Tdap 08/16/2008    Past Medical History:  Diagnosis Date  . BPH (benign prostatic hyperplasia)   . COPD (chronic obstructive pulmonary disease) ([place])   . Diverticulosis   . ED (erectile dysfunction)   . External hemorrhoid   . Hypertension   . Internal hemorrhoid   . Kidney stone   . Personal history of colonic adenomas 05/15/2012    Tobacco History: Social History   Tobacco Use  Smoking Status Former Smoker  . Packs/day:  2.00  . Years: 30.00  . Pack years: 60.00  . Types: Cigarettes  . Quit date: 1999  . Years since quitting: 22.4  Smokeless Tobacco Never Used   Counseling given: Not Answered   Outpatient Medications Prior to Visit  Medication Sig Dispense Refill  . albuterol (PROVENTIL HFA;VENTOLIN HFA) 108 (90 BASE) MCG/ACT inhaler Inhale 2 puffs into the lungs every 6 (six) hours as needed for wheezing or shortness of breath.    Marland Kitchen albuterol (PROVENTIL) (2.5 MG/3ML) 0.083% nebulizer solution Take 3 mLs (2.5 mg total) by nebulization every 4 (four) hours as needed for wheezing or shortness of breath. 75 mL 12  . alfuzosin (UROXATRAL) 10 MG 24 hr tablet Take 10 mg by mouth daily with breakfast.    . Ascorbic Acid (VITAMIN C) 1000 MG tablet Take 1,000 mg by mouth daily.    Marland Kitchen aspirin 81 MG tablet Take 81 mg by mouth daily.    Marland Kitchen CIALIS 5 MG tablet Take 5 mg by mouth daily as needed for erectile dysfunction.     . Fluticasone-Umeclidin-Vilant (TRELEGY ELLIPTA) 100-62.5-25 MCG/INH AEPB Inhale 1 puff into the lungs daily. 60 each 5  . ibuprofen (ADVIL,MOTRIN) 600 MG tablet Take 1 tablet (600 mg total) by mouth every 8 (eight) hours as needed. 15 tablet 0  . lisinopril (PRINIVIL,ZESTRIL) 20 MG tablet Take 20 mg by mouth daily.    . Multiple Vitamin (MULTIVITAMIN) tablet Take 1 tablet by mouth daily.  No facility-administered medications prior to visit.     Review of Systems:   Constitutional:   No  weight loss, night sweats,  Fevers, chills, + fatigue, or  lassitude.  HEENT:   No headaches,  Difficulty swallowing,  Tooth/dental problems, or  Sore throat,                No sneezing, itching, ear ache, nasal congestion, post nasal drip,   CV:  No chest pain,  Orthopnea, PND, swelling in lower extremities, anasarca, dizziness, palpitations, syncope.   GI  No heartburn, indigestion, abdominal pain, nausea, vomiting, diarrhea, change in bowel habits, loss of appetite, bloody stools.   Resp:    No chest  wall deformity  Skin: no rash or lesions.  GU: no dysuria, change in color of urine, no urgency or frequency.  No flank pain, no hematuria   MS:  No joint pain or swelling.  No decreased range of motion.  No back pain.    Physical Exam  BP 122/72 (BP Location: Left Arm, Cuff Size: Normal)   Pulse 92   Temp 97.6 F (36.4 C) (Oral)   Ht 5' 10.5" (1.791 m)   Wt 214 lb 9.6 oz (97.3 kg)   SpO2 94%   BMI 30.36 kg/m   GEN: A/Ox3; pleasant , NAD, BMI 30    HEENT:  [place]/AT,   THROAT-clear, no lesions, no postnasal drip or exudate noted.   NECK:  Supple w/ fair ROM; no JVD; normal carotid impulses w/o bruits; no thyromegaly or nodules palpated; no lymphadenopathy.    RESP  Clear  P & A; w/o, wheezes/ rales/ or rhonchi. no accessory muscle use, no dullness to percussion  CARD:  RRR, no m/r/g, no peripheral edema, pulses intact, no cyanosis or clubbing.  GI:   Soft & nt; nml bowel sounds; no organomegaly or masses detected.   Musco: Warm bil, no deformities or joint swelling noted.   Neuro: alert, no focal deficits noted.    Skin: Warm, no lesions or rashes    Lab Results:  CBC  BMET  ProBNP    No flowsheet data found.  No results found for: NITRICOXIDE      Assessment & Plan:   COPD (chronic obstructive pulmonary disease) ([place]) Improved symptom control on TRELEGY  Check chest xray today  Need PFT   Plan  Patient Instructions  Continue on TRELEGY 1 puff daily , rinse after use  Chest xray today .  Activity as tolerated.  Albuterol inhaler or Neb As needed   Follow up with Dr. Valeta Harms in 2 months with PFT and As needed            Rexene Edison, NP 08/11/2019

## 2019-08-11 NOTE — Patient Instructions (Addendum)
Continue on TRELEGY 1 puff daily , rinse after use  Chest xray today .  Activity as tolerated.  Albuterol inhaler or Neb As needed   Follow up with Dr. Valeta Harms in 2 months with PFT and As needed

## 2019-08-11 NOTE — Progress Notes (Signed)
Error

## 2019-08-11 NOTE — Assessment & Plan Note (Signed)
Improved symptom control on TRELEGY  Check chest xray today  Need PFT   Plan  Patient Instructions  Continue on TRELEGY 1 puff daily , rinse after use  Chest xray today .  Activity as tolerated.  Albuterol inhaler or Neb As needed   Follow up with Dr. Valeta Harms in 2 months with PFT and As needed

## 2019-08-12 DIAGNOSIS — Z86010 Personal history of colonic polyps: Secondary | ICD-10-CM | POA: Diagnosis not present

## 2019-08-12 DIAGNOSIS — B182 Chronic viral hepatitis C: Secondary | ICD-10-CM | POA: Diagnosis not present

## 2019-08-16 DIAGNOSIS — J449 Chronic obstructive pulmonary disease, unspecified: Secondary | ICD-10-CM | POA: Diagnosis not present

## 2019-09-15 DIAGNOSIS — J449 Chronic obstructive pulmonary disease, unspecified: Secondary | ICD-10-CM | POA: Diagnosis not present

## 2019-10-07 ENCOUNTER — Ambulatory Visit: Payer: PPO | Admitting: Primary Care

## 2019-10-07 ENCOUNTER — Other Ambulatory Visit: Payer: Self-pay

## 2019-10-07 ENCOUNTER — Encounter: Payer: Self-pay | Admitting: Primary Care

## 2019-10-07 ENCOUNTER — Ambulatory Visit (INDEPENDENT_AMBULATORY_CARE_PROVIDER_SITE_OTHER): Payer: PPO | Admitting: Pulmonary Disease

## 2019-10-07 VITALS — BP 124/72 | HR 78 | Temp 97.3°F | Ht 72.0 in | Wt 217.0 lb

## 2019-10-07 DIAGNOSIS — R0602 Shortness of breath: Secondary | ICD-10-CM | POA: Diagnosis not present

## 2019-10-07 DIAGNOSIS — J449 Chronic obstructive pulmonary disease, unspecified: Secondary | ICD-10-CM

## 2019-10-07 LAB — PULMONARY FUNCTION TEST
DL/VA % pred: 99 %
DL/VA: 4.07 ml/min/mmHg/L
DLCO cor % pred: 65 %
DLCO cor: 17.24 ml/min/mmHg
DLCO unc % pred: 65 %
DLCO unc: 17.24 ml/min/mmHg
FEF 25-75 Post: 0.61 L/sec
FEF 25-75 Pre: 0.46 L/sec
FEF2575-%Change-Post: 33 %
FEF2575-%Pred-Post: 23 %
FEF2575-%Pred-Pre: 17 %
FEV1-%Change-Post: 12 %
FEV1-%Pred-Post: 39 %
FEV1-%Pred-Pre: 35 %
FEV1-Post: 1.34 L
FEV1-Pre: 1.19 L
FEV1FVC-%Change-Post: 7 %
FEV1FVC-%Pred-Pre: 66 %
FEV6-%Change-Post: 7 %
FEV6-%Pred-Post: 58 %
FEV6-%Pred-Pre: 54 %
FEV6-Post: 2.51 L
FEV6-Pre: 2.34 L
FEV6FVC-%Change-Post: 2 %
FEV6FVC-%Pred-Post: 104 %
FEV6FVC-%Pred-Pre: 101 %
FVC-%Change-Post: 4 %
FVC-%Pred-Post: 55 %
FVC-%Pred-Pre: 53 %
FVC-Post: 2.53 L
FVC-Pre: 2.42 L
Post FEV1/FVC ratio: 53 %
Post FEV6/FVC ratio: 99 %
Pre FEV1/FVC ratio: 49 %
Pre FEV6/FVC Ratio: 97 %
RV % pred: 182 %
RV: 4.36 L
TLC % pred: 99 %
TLC: 6.97 L

## 2019-10-07 MED ORDER — ALBUTEROL SULFATE HFA 108 (90 BASE) MCG/ACT IN AERS: 2.0000 | INHALATION_SPRAY | Freq: Four times a day (QID) | RESPIRATORY_TRACT | 3 refills | Status: DC | PRN

## 2019-10-07 NOTE — Patient Instructions (Signed)
PFTs showed evidence of mixed COPD/asthma   Recommendations: Continue Trelegy 100 one puff daily (rinse mouth after use)  Orders: Labs today (alpha-1 level, cbc and IgE)  Follow-up: 6 months with Dr. Valeta Harms or sooner if you develop bronchitis symptoms/worsening shortness of breath    COPD and Physical Activity Chronic obstructive pulmonary disease (COPD) is a long-term (chronic) condition that affects the lungs. COPD is a general term that can be used to describe many different lung problems that cause lung swelling (inflammation) and limit airflow, including chronic bronchitis and emphysema. The main symptom of COPD is shortness of breath, which makes it harder to do even simple tasks. This can also make it harder to exercise and be active. Talk with your health care provider about treatments to help you breathe better and actions you can take to prevent breathing problems during physical activity. What are the benefits of exercising with COPD? Exercising regularly is an important part of a healthy lifestyle. You can still exercise and do physical activities even though you have COPD. Exercise and physical activity improve your shortness of breath by increasing blood flow (circulation). This causes your heart to pump more oxygen through your body. Moderate exercise can improve your:  Oxygen use.  Energy level.  Shortness of breath.  Strength in your breathing muscles.  Heart health.  Sleep.  Self-esteem and feelings of self-worth.  Depression, stress, and anxiety levels. Exercise can benefit everyone with COPD. The severity of your disease may affect how hard you can exercise, especially at first, but everyone can benefit. Talk with your health care provider about how much exercise is safe for you, and which activities and exercises are safe for you. What actions can I take to prevent breathing problems during physical activity?  Sign up for a pulmonary rehabilitation program.  This type of program may include: ? Education about lung diseases. ? Exercise classes that teach you how to exercise and be more active while improving your breathing. This usually involves:  Exercise using your lower extremities, such as a stationary bicycle.  About 30 minutes of exercise, 2 to 5 times per week, for 6 to 12 weeks  Strength training, such as push ups or leg lifts. ? Nutrition education. ? Group classes in which you can talk with others who also have COPD and learn ways to manage stress.  If you use an oxygen tank, you should use it while you exercise. Work with your health care provider to adjust your oxygen for your physical activity. Your resting flow rate is different from your flow rate during physical activity.  While you are exercising: ? Take slow breaths. ? Pace yourself and do not try to go too fast. ? Purse your lips while breathing out. Pursing your lips is similar to a kissing or whistling position. ? If doing exercise that uses a quick burst of effort, such as weight lifting:  Breathe in before starting the exercise.  Breathe out during the hardest part of the exercise (such as raising the weights). Where to find support You can find support for exercising with COPD from:  Your health care provider.  A pulmonary rehabilitation program.  Your local health department or community health programs.  Support groups, online or in-person. Your health care provider may be able to recommend support groups. Where to find more information You can find more information about exercising with COPD from:  American Lung Association: ClassInsider.se.  COPD Foundation: https://www.rivera.net/. Contact a health care provider if:  Your  symptoms get worse.  You have chest pain.  You have nausea.  You have a fever.  You have trouble talking or catching your breath.  You want to start a new exercise program or a new activity. Summary  COPD is a general term that can  be used to describe many different lung problems that cause lung swelling (inflammation) and limit airflow. This includes chronic bronchitis and emphysema.  Exercise and physical activity improve your shortness of breath by increasing blood flow (circulation). This causes your heart to provide more oxygen to your body.  Contact your health care provider before starting any exercise program or new activity. Ask your health care provider what exercises and activities are safe for you. This information is not intended to replace advice given to you by your health care provider. Make sure you discuss any questions you have with your health care provider. Document Revised: 06/11/2018 Document Reviewed: 03/14/2017 Elsevier Patient Education  2020 [employer].

## 2019-10-07 NOTE — Progress Notes (Signed)
@Patient  ID: Benjamin [name], male    DOB: 1951/03/05, 69 y.o.   MRN: [id number]  Chief Complaint  Patient presents with  . Follow-up    SOB    Referring provider: Aretta Nip, MD  HPI: 69 year old male, former smoker quit in 1999 (60 pack year hx). PMH significant for COPD/asthma, hyperlipidemia. Patient of Dr. Valeta Harms, last seen by pulmonary NP on 08/11/19.   10/07/2019 Patient presents today for regular follow-up for COPD/ASTHMA. He is doing well on Trelegy. He mostly only experiences dyspnea symptoms when walking up incline.  He had to give up tennis because of his breathing. He is still playing pickle ball and continues to work in garden. He is thinking about taking up golf. No signification cough, chest tightness or wheezing.  Pulmonary function testing 10/07/19- FVC 2.53 (55%), FEV1 1.34 (39%), ratio 53, DLCOcor 65% +BD   Allergies  Allergen Reactions  . Keflex [Cephalexin] Rash  . Penicillins Rash    Immunization History  Administered Date(s) Administered  . Influenza, Quadrivalent, Recombinant, Inj, Pf 12/15/2018  . Influenza-Unspecified 01/05/2015  . PFIZER SARS-COV-2 Vaccination 04/11/2019, 05/05/2019  . Pneumococcal Conjugate-13 02/02/2015  . Pneumococcal-Unspecified 09/26/2010  . Tdap 08/16/2008    Past Medical History:  Diagnosis Date  . BPH (benign prostatic hyperplasia)   . COPD (chronic obstructive pulmonary disease) ([place])   . Diverticulosis   . ED (erectile dysfunction)   . External hemorrhoid   . Hypertension   . Internal hemorrhoid   . Kidney stone   . Personal history of colonic adenomas 05/15/2012    Tobacco History: Social History   Tobacco Use  Smoking Status Former Smoker  . Packs/day: 2.00  . Years: 30.00  . Pack years: 60.00  . Types: Cigarettes  . Quit date: 1999  . Years since quitting: 22.6  Smokeless Tobacco Never Used   Counseling given: Not Answered   Outpatient Medications Prior to Visit  Medication Sig Dispense  Refill  . albuterol (PROVENTIL) (2.5 MG/3ML) 0.083% nebulizer solution Take 3 mLs (2.5 mg total) by nebulization every 4 (four) hours as needed for wheezing or shortness of breath. 75 mL 12  . alfuzosin (UROXATRAL) 10 MG 24 hr tablet Take 10 mg by mouth daily with breakfast.    . Ascorbic Acid (VITAMIN C) 1000 MG tablet Take 1,000 mg by mouth daily.    Marland Kitchen aspirin 81 MG tablet Take 81 mg by mouth daily.    Marland Kitchen CIALIS 5 MG tablet Take 5 mg by mouth daily as needed for erectile dysfunction.     . Fluticasone-Umeclidin-Vilant (TRELEGY ELLIPTA) 100-62.5-25 MCG/INH AEPB Inhale 1 puff into the lungs daily. 60 each 5  . ibuprofen (ADVIL,MOTRIN) 600 MG tablet Take 1 tablet (600 mg total) by mouth every 8 (eight) hours as needed. 15 tablet 0  . lisinopril (PRINIVIL,ZESTRIL) 20 MG tablet Take 20 mg by mouth daily.    . Multiple Vitamin (MULTIVITAMIN) tablet Take 1 tablet by mouth daily.    Marland Kitchen albuterol (PROVENTIL HFA;VENTOLIN HFA) 108 (90 BASE) MCG/ACT inhaler Inhale 2 puffs into the lungs every 6 (six) hours as needed for wheezing or shortness of breath.     No facility-administered medications prior to visit.    Review of Systems  Review of Systems  Constitutional: Negative.   HENT: Negative.   Respiratory: Negative.     Physical Exam  BP 124/72 (BP Location: Left Arm, Cuff Size: Normal)   Pulse 78   Temp (!) 97.3 F (36.3 C) (Oral)  Ht 6' (1.829 m)   Wt 217 lb (98.4 kg)   SpO2 94%   BMI 29.43 kg/m  Physical Exam Constitutional:      Appearance: Normal appearance.  HENT:     Head: Normocephalic and atraumatic.  Cardiovascular:     Rate and Rhythm: Normal rate.  Pulmonary:     Effort: Pulmonary effort is normal.  Skin:    General: Skin is warm and dry.  Neurological:     General: No focal deficit present.     Mental Status: He is alert and oriented to person, place, and time. Mental status is at baseline.  Psychiatric:        Mood and Affect: Mood normal.        Behavior:  Behavior normal.        Thought Content: Thought content normal.        Judgment: Judgment normal.      Lab Results:  CBC    Component Value Date/Time   WBC 9.0 10/07/2019 1640   RBC 4.54 10/07/2019 1640   HGB 14.6 10/07/2019 1640   HCT 43.6 10/07/2019 1640   PLT 187.0 10/07/2019 1640   MCV 96.0 10/07/2019 1640   MCH 30.6 12/24/2013 0951   MCHC 33.4 10/07/2019 1640   RDW 13.3 10/07/2019 1640   LYMPHSABS 2.2 10/07/2019 1640   MONOABS 0.6 10/07/2019 1640   EOSABS 0.0 10/07/2019 1640   BASOSABS 0.1 10/07/2019 1640    BMET    Component Value Date/Time   NA 134 (L) 12/25/2013 0010   K 4.5 12/25/2013 0010   CL 98 12/25/2013 0010   CO2 25 12/25/2013 0010   GLUCOSE 105 (H) 12/25/2013 0010   BUN 16 12/25/2013 0010   CREATININE 1.66 (H) 12/25/2013 0010   CALCIUM 9.5 12/25/2013 0010   GFRNONAA 43 (L) 12/25/2013 0010   GFRAA 50 (L) 12/25/2013 0010    BNP No results found for: BNP  ProBNP No results found for: PROBNP  Imaging: No results found.   Assessment & Plan:   COPD (chronic obstructive pulmonary disease) (HCC) - PFTs showed evidence of severe COPD with reversibility  - Clinically doing well on triple therapy. Remains active, experiences dyspnea mainly on inclines - Continue Trelegy 100 one puff daily (rinse mouth after use) - Orders: Labs today (alpha-1 level, cbc and IgE) - Follow-up: 6 months with Dr. Valeta Harms or sooner if you develop bronchitis symptoms/worsening shortness of breath     Martyn Ehrich, NP 10/30/2019

## 2019-10-07 NOTE — Progress Notes (Signed)
Full PFT performed today. °

## 2019-10-08 LAB — CBC WITH DIFFERENTIAL/PLATELET
Basophils Absolute: 0.1 10*3/uL (ref 0.0–0.1)
Basophils Relative: 0.7 % (ref 0.0–3.0)
Eosinophils Absolute: 0 10*3/uL (ref 0.0–0.7)
Eosinophils Relative: 0.2 % (ref 0.0–5.0)
HCT: 43.6 % (ref 39.0–52.0)
Hemoglobin: 14.6 g/dL (ref 13.0–17.0)
Lymphocytes Relative: 24.1 % (ref 12.0–46.0)
Lymphs Abs: 2.2 10*3/uL (ref 0.7–4.0)
MCHC: 33.4 g/dL (ref 30.0–36.0)
MCV: 96 fl (ref 78.0–100.0)
Monocytes Absolute: 0.6 10*3/uL (ref 0.1–1.0)
Monocytes Relative: 7 % (ref 3.0–12.0)
Neutro Abs: 6.1 10*3/uL (ref 1.4–7.7)
Neutrophils Relative %: 68 % (ref 43.0–77.0)
Platelets: 187 10*3/uL (ref 150.0–400.0)
RBC: 4.54 Mil/uL (ref 4.22–5.81)
RDW: 13.3 % (ref 11.5–15.5)
WBC: 9 10*3/uL (ref 4.0–10.5)

## 2019-10-12 ENCOUNTER — Ambulatory Visit: Payer: PPO | Admitting: Adult Health

## 2019-10-13 LAB — ALPHA-1 ANTITRYPSIN PHENOTYPE: A-1 Antitrypsin, Ser: 138 mg/dL (ref 83–199)

## 2019-10-13 LAB — IGE: IgE (Immunoglobulin E), Serum: 11 kU/L (ref <=114)

## 2019-10-16 DIAGNOSIS — J449 Chronic obstructive pulmonary disease, unspecified: Secondary | ICD-10-CM | POA: Diagnosis not present

## 2019-10-30 NOTE — Assessment & Plan Note (Addendum)
-   PFTs showed evidence of severe COPD with reversibility  - Clinically doing well on triple therapy. Remains active, experiences dyspnea mainly on inclines - Continue Trelegy 100 one puff daily (rinse mouth after use) - Orders: Labs today (alpha-1 level, cbc and IgE) - Follow-up: 6 months with Dr. Valeta Harms or sooner if you develop bronchitis symptoms/worsening shortness of breath

## 2019-11-16 DIAGNOSIS — J449 Chronic obstructive pulmonary disease, unspecified: Secondary | ICD-10-CM | POA: Diagnosis not present

## 2019-11-17 ENCOUNTER — Ambulatory Visit: Payer: PPO | Attending: Internal Medicine

## 2019-11-17 DIAGNOSIS — Z23 Encounter for immunization: Secondary | ICD-10-CM

## 2019-11-17 NOTE — Progress Notes (Signed)
   Covid-19 Vaccination Clinic  Name:  WILBERT HAYASHI    MRN: [id number] DOB: [birth year]  11/17/2019  Mr. Dibiasio was observed post Covid-19 immunization for 15 minutes without incident. He was provided with Vaccine Information Sheet and instruction to access the V-Safe system.   Mr. Kalmbach was instructed to call 911 with any severe reactions post vaccine: Marland Kitchen Difficulty breathing  . Swelling of face and throat  . A fast heartbeat  . A bad rash all over body  . Dizziness and weakness

## 2019-12-16 DIAGNOSIS — J449 Chronic obstructive pulmonary disease, unspecified: Secondary | ICD-10-CM | POA: Diagnosis not present

## 2020-01-12 ENCOUNTER — Telehealth: Payer: Self-pay | Admitting: Pulmonary Disease

## 2020-01-12 MED ORDER — TRELEGY ELLIPTA 100-62.5-25 MCG/INH IN AEPB: 1.0000 | INHALATION_SPRAY | Freq: Every day | RESPIRATORY_TRACT | 5 refills | Status: DC

## 2020-01-12 NOTE — Telephone Encounter (Signed)
Pt requesting refill on Trelegy.  This has been sent to preferred pharmacy.  Nothing further needed at this time- will close encounter.   

## 2020-01-16 DIAGNOSIS — J449 Chronic obstructive pulmonary disease, unspecified: Secondary | ICD-10-CM | POA: Diagnosis not present

## 2020-01-19 DIAGNOSIS — L821 Other seborrheic keratosis: Secondary | ICD-10-CM | POA: Diagnosis not present

## 2020-01-19 DIAGNOSIS — D225 Melanocytic nevi of trunk: Secondary | ICD-10-CM | POA: Diagnosis not present

## 2020-01-19 DIAGNOSIS — L820 Inflamed seborrheic keratosis: Secondary | ICD-10-CM | POA: Diagnosis not present

## 2020-01-19 DIAGNOSIS — L814 Other melanin hyperpigmentation: Secondary | ICD-10-CM | POA: Diagnosis not present

## 2020-01-19 DIAGNOSIS — L570 Actinic keratosis: Secondary | ICD-10-CM | POA: Diagnosis not present

## 2020-01-19 DIAGNOSIS — Z85828 Personal history of other malignant neoplasm of skin: Secondary | ICD-10-CM | POA: Diagnosis not present

## 2020-01-19 DIAGNOSIS — D1801 Hemangioma of skin and subcutaneous tissue: Secondary | ICD-10-CM | POA: Diagnosis not present

## 2020-01-19 DIAGNOSIS — B078 Other viral warts: Secondary | ICD-10-CM | POA: Diagnosis not present

## 2020-01-27 ENCOUNTER — Other Ambulatory Visit: Payer: Self-pay | Admitting: Gastroenterology

## 2020-01-27 DIAGNOSIS — B1920 Unspecified viral hepatitis C without hepatic coma: Secondary | ICD-10-CM

## 2020-02-10 DIAGNOSIS — N644 Mastodynia: Secondary | ICD-10-CM | POA: Diagnosis not present

## 2020-02-10 DIAGNOSIS — Z23 Encounter for immunization: Secondary | ICD-10-CM | POA: Diagnosis not present

## 2020-02-10 DIAGNOSIS — F1010 Alcohol abuse, uncomplicated: Secondary | ICD-10-CM | POA: Diagnosis not present

## 2020-02-10 DIAGNOSIS — N62 Hypertrophy of breast: Secondary | ICD-10-CM | POA: Diagnosis not present

## 2020-02-12 ENCOUNTER — Other Ambulatory Visit: Payer: Self-pay | Admitting: Family Medicine

## 2020-02-12 DIAGNOSIS — N644 Mastodynia: Secondary | ICD-10-CM

## 2020-02-12 DIAGNOSIS — N62 Hypertrophy of breast: Secondary | ICD-10-CM

## 2020-02-15 ENCOUNTER — Ambulatory Visit
Admission: RE | Admit: 2020-02-15 | Discharge: 2020-02-15 | Disposition: A | Discharge status: Home / Self Care | Payer: PPO | Source: Ambulatory Visit | Attending: Gastroenterology | Admitting: Gastroenterology

## 2020-02-15 DIAGNOSIS — J449 Chronic obstructive pulmonary disease, unspecified: Secondary | ICD-10-CM | POA: Diagnosis not present

## 2020-02-15 DIAGNOSIS — B1920 Unspecified viral hepatitis C without hepatic coma: Secondary | ICD-10-CM

## 2020-02-15 DIAGNOSIS — K7689 Other specified diseases of liver: Secondary | ICD-10-CM | POA: Diagnosis not present

## 2020-03-17 DIAGNOSIS — J449 Chronic obstructive pulmonary disease, unspecified: Secondary | ICD-10-CM | POA: Diagnosis not present

## 2020-03-23 ENCOUNTER — Other Ambulatory Visit: Payer: PPO

## 2020-03-24 ENCOUNTER — Ambulatory Visit: Payer: PPO

## 2020-03-24 ENCOUNTER — Other Ambulatory Visit: Payer: Self-pay

## 2020-03-24 ENCOUNTER — Ambulatory Visit
Admission: RE | Admit: 2020-03-24 | Discharge: 2020-03-24 | Disposition: A | Discharge status: Home / Self Care | Payer: PPO | Source: Ambulatory Visit | Attending: Family Medicine | Admitting: Family Medicine

## 2020-03-24 DIAGNOSIS — N62 Hypertrophy of breast: Secondary | ICD-10-CM

## 2020-03-24 DIAGNOSIS — N644 Mastodynia: Secondary | ICD-10-CM

## 2020-04-04 ENCOUNTER — Other Ambulatory Visit: Payer: Self-pay

## 2020-04-04 ENCOUNTER — Ambulatory Visit: Payer: PPO | Admitting: Pulmonary Disease

## 2020-04-04 ENCOUNTER — Encounter: Payer: Self-pay | Admitting: Pulmonary Disease

## 2020-04-04 VITALS — BP 120/80 | HR 95 | Temp 97.3°F | Ht 70.5 in | Wt 217.1 lb

## 2020-04-04 DIAGNOSIS — N644 Mastodynia: Secondary | ICD-10-CM | POA: Diagnosis not present

## 2020-04-04 DIAGNOSIS — Z87891 Personal history of nicotine dependence: Secondary | ICD-10-CM

## 2020-04-04 DIAGNOSIS — J449 Chronic obstructive pulmonary disease, unspecified: Secondary | ICD-10-CM

## 2020-04-04 MED ORDER — TRELEGY ELLIPTA 100-62.5-25 MCG/INH IN AEPB: 1.0000 | INHALATION_SPRAY | Freq: Every day | RESPIRATORY_TRACT | 4 refills | Status: AC

## 2020-04-04 NOTE — Patient Instructions (Addendum)
Thank you for visiting Dr. Valeta Harms at [facility]. Today we recommend the following:  Refills of Trelegy inhaler  Return in about 1 year (around 04/04/2021) for with APP or Dr. Valeta Harms.    Please do your part to reduce the spread of COVID-19.

## 2020-04-04 NOTE — Progress Notes (Signed)
Synopsis: Referred in May 2021 for COPD by Aretta Nip, MD  Subjective:   PATIENT ID: Benjamin [name] GENDER: male DOB: 1950-05-25, MRN: [id number]  Chief Complaint  Patient presents with  . Follow-up    Pt has been traveling and had some issues with his breathing but better since being home.  Using his rescue inhaler about once daily.      This is a 70 year old gentleman former tobacco abuse history quit smoking when he was 29 smoked from the age 79 for 30 years greater than a pack a day.  Diagnosis COPD in the past.  Has been managed for the past 10 years on Symbicort and as needed albuterol.  He tries to stay active however is limited due to his dyspnea at this point.  Additional medical history includes hypertension BPH.  Lives with his wife.  Recently had first grandchild.  Daughter is a travel Marine scientist in [place].  He recently traveled there to visit.  Also traveled to [facility] to visit son and new grandchild.  Both him and his wife have received their Covid vaccine.  Overall he has had progressive shortness of breath for the past several weeks.  He the pollen outside feels like it is more difficult for him to breathe.  He does have occasional wheezing.  He does not have a nebulizer at home.  OV 04/04/2020: Here today for COPD follow-up.  Patient had pulmonary function test completed in August with an FEV1 of 39% predicted, elevated TLC and RV ratio concerning for air trapping.  We reviewed patient's pulmonary function test today in the office.  He was switched at that time to Trelegy inhaler.  Has been doing okay with that.  He does feel like his albuterol nebulizer makes his heart race at times.  Otherwise has not had any hospitalizations or any exacerbations this past year after being placed on Trelegy.  He was able to go visit his daughter in [place].  Who works as a travel Marine scientist.   Past Medical History:  Diagnosis Date  . BPH (benign prostatic hyperplasia)   . COPD  (chronic obstructive pulmonary disease) ([place])   . Diverticulosis   . ED (erectile dysfunction)   . External hemorrhoid   . Hypertension   . Internal hemorrhoid   . Kidney stone   . Personal history of colonic adenomas 05/15/2012     Family History  Problem Relation Age of Onset  . Ovarian cancer Mother   . Thyroid disease Mother   . Leukemia Mother   . Bone cancer Mother   . Prostate cancer Father   . Hypertension Father   . Colon cancer Neg Hx      Past Surgical History:  Procedure Laterality Date  . COLONOSCOPY W/ POLYPECTOMY    . dental implants     upper  . HEMORRHOID SURGERY    . LEG SURGERY Right    fx- right repair plate screw    Social History   Socioeconomic History  . Marital status: Married    Spouse name: Not on file  . Number of children: Not on file  . Years of education: Not on file  . Highest education level: Not on file  Occupational History  . Not on file  Tobacco Use  . Smoking status: Former Smoker    Packs/day: 2.00    Years: 30.00    Pack years: 60.00    Types: Cigarettes    Quit date: 1999  Years since quitting: 23.0  . Smokeless tobacco: Never Used  Substance and Sexual Activity  . Alcohol use: No    Alcohol/week: 0.0 standard drinks  . Drug use: Yes    Types: Marijuana  . Sexual activity: Not on file  Other Topics Concern  . Not on file  Social History Narrative  . Not on file   Social Determinants of Health   Financial Resource Strain: Not on file  Food Insecurity: Not on file  Transportation Needs: Not on file  Physical Activity: Not on file  Stress: Not on file  Social Connections: Not on file  Intimate Partner Violence: Not on file     Allergies  Allergen Reactions  . Keflex [Cephalexin] Rash  . Penicillins Rash     Outpatient Medications Prior to Visit  Medication Sig Dispense Refill  . albuterol (PROVENTIL) (2.5 MG/3ML) 0.083% nebulizer solution Take 3 mLs (2.5 mg total) by nebulization every 4 (four)  hours as needed for wheezing or shortness of breath. 75 mL 12  . albuterol (VENTOLIN HFA) 108 (90 Base) MCG/ACT inhaler Inhale 2 puffs into the lungs every 6 (six) hours as needed for wheezing or shortness of breath. 108 g 3  . alfuzosin (UROXATRAL) 10 MG 24 hr tablet Take 10 mg by mouth daily with breakfast.    . Ascorbic Acid (VITAMIN C) 1000 MG tablet Take 1,000 mg by mouth daily.    Marland Kitchen aspirin 81 MG tablet Take 81 mg by mouth daily.    Marland Kitchen CIALIS 5 MG tablet Take 5 mg by mouth daily as needed for erectile dysfunction.     . Fluticasone-Umeclidin-Vilant (TRELEGY ELLIPTA) 100-62.5-25 MCG/INH AEPB Inhale 1 puff into the lungs daily. 60 each 5  . ibuprofen (ADVIL,MOTRIN) 600 MG tablet Take 1 tablet (600 mg total) by mouth every 8 (eight) hours as needed. 15 tablet 0  . lisinopril (PRINIVIL,ZESTRIL) 20 MG tablet Take 20 mg by mouth daily.    . Multiple Vitamin (MULTIVITAMIN) tablet Take 1 tablet by mouth daily.     No facility-administered medications prior to visit.    Review of Systems  Constitutional: Negative for chills, fever, malaise/fatigue and weight loss.  HENT: Negative for hearing loss, sore throat and tinnitus.   Eyes: Negative for blurred vision and double vision.  Respiratory: Positive for shortness of breath. Negative for cough, hemoptysis, sputum production, wheezing and stridor.   Cardiovascular: Negative for chest pain, palpitations, orthopnea, leg swelling and PND.  Gastrointestinal: Negative for abdominal pain, constipation, diarrhea, heartburn, nausea and vomiting.  Genitourinary: Negative for dysuria, hematuria and urgency.  Musculoskeletal: Negative for joint pain and myalgias.  Skin: Negative for itching and rash.  Neurological: Negative for dizziness, tingling, weakness and headaches.  Endo/Heme/Allergies: Negative for environmental allergies. Does not bruise/bleed easily.  Psychiatric/Behavioral: Negative for depression. The patient is not nervous/anxious and does not  have insomnia.   All other systems reviewed and are negative.    Objective:  Physical Exam Vitals reviewed.  Constitutional:      General: He is not in acute distress.    Appearance: He is well-developed and well-nourished. He is obese.  HENT:     Head: Normocephalic and atraumatic.     Mouth/Throat:     Mouth: Oropharynx is clear and moist.  Eyes:     General: No scleral icterus.    Conjunctiva/sclera: Conjunctivae normal.     Pupils: Pupils are equal, round, and reactive to light.  Neck:     Vascular: No JVD.  Trachea: No tracheal deviation.  Cardiovascular:     Rate and Rhythm: Normal rate and regular rhythm.     Pulses: Intact distal pulses.     Heart sounds: Normal heart sounds. No murmur heard.   Pulmonary:     Effort: Pulmonary effort is normal. No tachypnea, accessory muscle usage or respiratory distress.     Breath sounds: Normal breath sounds. No stridor. No wheezing, rhonchi or rales.     Comments: Bilateral mild gynecomastia Abdominal:     General: Bowel sounds are normal. There is no distension.     Palpations: Abdomen is soft.     Tenderness: There is no abdominal tenderness.  Musculoskeletal:        General: No tenderness or edema.     Cervical back: Neck supple.  Lymphadenopathy:     Cervical: No cervical adenopathy.  Skin:    General: Skin is warm and dry.     Capillary Refill: Capillary refill takes less than 2 seconds.     Findings: No rash.  Neurological:     Mental Status: He is alert and oriented to person, place, and time.  Psychiatric:        Mood and Affect: Mood and affect normal.        Behavior: Behavior normal.      Vitals:   04/04/20 1533  BP: 120/80  Pulse: 95  Temp: (!) 97.3 F (36.3 C)  TempSrc: Tympanic  SpO2: 97%  Weight: 217 lb 2 oz (98.5 kg)  Height: 5' 10.5" (1.791 m)   97% on RA BMI Readings from Last 3 Encounters:  04/04/20 30.71 kg/m  10/07/19 29.43 kg/m  08/11/19 30.36 kg/m   Wt Readings from Last 3  Encounters:  04/04/20 217 lb 2 oz (98.5 kg)  10/07/19 217 lb (98.4 kg)  08/11/19 214 lb 9.6 oz (97.3 kg)     CBC    Component Value Date/Time   WBC 9.0 10/07/2019 1640   RBC 4.54 10/07/2019 1640   HGB 14.6 10/07/2019 1640   HCT 43.6 10/07/2019 1640   PLT 187.0 10/07/2019 1640   MCV 96.0 10/07/2019 1640   MCH 30.6 12/24/2013 0951   MCHC 33.4 10/07/2019 1640   RDW 13.3 10/07/2019 1640   LYMPHSABS 2.2 10/07/2019 1640   MONOABS 0.6 10/07/2019 1640   EOSABS 0.0 10/07/2019 1640   BASOSABS 0.1 10/07/2019 1640    Chest Imaging: No recent imaging   Pulmonary Functions Testing Results: PFT Results Latest Ref Rng & Units 10/07/2019  FVC-Pre L 2.42  FVC-Predicted Pre % 53  FVC-Post L 2.53  FVC-Predicted Post % 55  Pre FEV1/FVC % % 49  Post FEV1/FCV % % 53  FEV1-Pre L 1.19  FEV1-Predicted Pre % 35  FEV1-Post L 1.34  DLCO uncorrected ml/min/mmHg 17.24  DLCO UNC% % 65  DLCO corrected ml/min/mmHg 17.24  DLCO COR %Predicted % 65  DLVA Predicted % 99  TLC L 6.97  TLC % Predicted % 99  RV % Predicted % 182    FeNO: none   Pathology: none  Echocardiogram: none  Heart Catheterization: none     Assessment & Plan:     ICD-10-CM   1. Stage 3 severe COPD by GOLD classification ([place])  J44.9   2. Former smoker  Z87.891   3. Nipple tenderness  N64.4     Discussion: 70 year old gentleman, former smoker quit several years ago, 30-pack-year history.  Recent pulmonary function test with 39% FEV1 predicted.  Currently managed with triple therapy inhaler  regimen.    Plan: Today in the office that were completed in August. Continue Trelegy inhaler Continue vitamin D supplement Discussed risk of scuba diving with severe lung disease.  I advised against this. Also counseled today that I do not believe that his inhalers were causing the occasional nipple tenderness.    Current Outpatient Medications:  .  albuterol (PROVENTIL) (2.5 MG/3ML) 0.083% nebulizer solution, Take 3 mLs  (2.5 mg total) by nebulization every 4 (four) hours as needed for wheezing or shortness of breath., Disp: 75 mL, Rfl: 12 .  albuterol (VENTOLIN HFA) 108 (90 Base) MCG/ACT inhaler, Inhale 2 puffs into the lungs every 6 (six) hours as needed for wheezing or shortness of breath., Disp: 108 g, Rfl: 3 .  alfuzosin (UROXATRAL) 10 MG 24 hr tablet, Take 10 mg by mouth daily with breakfast., Disp: , Rfl:  .  Ascorbic Acid (VITAMIN C) 1000 MG tablet, Take 1,000 mg by mouth daily., Disp: , Rfl:  .  aspirin 81 MG tablet, Take 81 mg by mouth daily., Disp: , Rfl:  .  CIALIS 5 MG tablet, Take 5 mg by mouth daily as needed for erectile dysfunction. , Disp: , Rfl:  .  Fluticasone-Umeclidin-Vilant (TRELEGY ELLIPTA) 100-62.5-25 MCG/INH AEPB, Inhale 1 puff into the lungs daily., Disp: 60 each, Rfl: 5 .  ibuprofen (ADVIL,MOTRIN) 600 MG tablet, Take 1 tablet (600 mg total) by mouth every 8 (eight) hours as needed., Disp: 15 tablet, Rfl: 0 .  lisinopril (PRINIVIL,ZESTRIL) 20 MG tablet, Take 20 mg by mouth daily., Disp: , Rfl:  .  Multiple Vitamin (MULTIVITAMIN) tablet, Take 1 tablet by mouth daily., Disp: , Rfl:   I spent 30 minutes dedicated to the care of this patient on the date of this encounter to include pre-visit review of records, face-to-face time with the patient discussing conditions above, post visit ordering of testing, clinical documentation with the electronic health record, making appropriate referrals as documented, and communicating necessary findings to members of the patients care team.    Garner Nash, DO [facility] 04/04/2020 3:45 PM

## 2020-04-17 DIAGNOSIS — J449 Chronic obstructive pulmonary disease, unspecified: Secondary | ICD-10-CM | POA: Diagnosis not present

## 2020-05-15 DIAGNOSIS — J449 Chronic obstructive pulmonary disease, unspecified: Secondary | ICD-10-CM | POA: Diagnosis not present

## 2020-06-15 DIAGNOSIS — J449 Chronic obstructive pulmonary disease, unspecified: Secondary | ICD-10-CM | POA: Diagnosis not present

## 2020-07-15 DIAGNOSIS — J449 Chronic obstructive pulmonary disease, unspecified: Secondary | ICD-10-CM | POA: Diagnosis not present

## 2020-07-28 DIAGNOSIS — L820 Inflamed seborrheic keratosis: Secondary | ICD-10-CM | POA: Diagnosis not present

## 2020-07-28 DIAGNOSIS — L821 Other seborrheic keratosis: Secondary | ICD-10-CM | POA: Diagnosis not present

## 2020-07-28 DIAGNOSIS — D1801 Hemangioma of skin and subcutaneous tissue: Secondary | ICD-10-CM | POA: Diagnosis not present

## 2020-07-28 DIAGNOSIS — Z85828 Personal history of other malignant neoplasm of skin: Secondary | ICD-10-CM | POA: Diagnosis not present

## 2020-07-28 DIAGNOSIS — L814 Other melanin hyperpigmentation: Secondary | ICD-10-CM | POA: Diagnosis not present

## 2020-08-12 ENCOUNTER — Other Ambulatory Visit: Payer: Self-pay | Admitting: Gastroenterology

## 2020-08-12 DIAGNOSIS — K7402 Hepatic fibrosis, advanced fibrosis: Secondary | ICD-10-CM

## 2020-08-12 DIAGNOSIS — B1920 Unspecified viral hepatitis C without hepatic coma: Secondary | ICD-10-CM

## 2020-09-04 ENCOUNTER — Other Ambulatory Visit: Payer: Self-pay | Admitting: Primary Care

## 2020-09-13 ENCOUNTER — Other Ambulatory Visit: Payer: Self-pay | Admitting: Family Medicine

## 2020-09-13 DIAGNOSIS — K7402 Hepatic fibrosis, advanced fibrosis: Secondary | ICD-10-CM

## 2020-09-13 DIAGNOSIS — B182 Chronic viral hepatitis C: Secondary | ICD-10-CM

## 2020-09-16 ENCOUNTER — Ambulatory Visit
Admission: RE | Admit: 2020-09-16 | Discharge: 2020-09-16 | Disposition: A | Discharge status: Home / Self Care | Payer: PPO | Source: Ambulatory Visit | Attending: Gastroenterology | Admitting: Gastroenterology

## 2020-09-16 DIAGNOSIS — K7402 Hepatic fibrosis, advanced fibrosis: Secondary | ICD-10-CM

## 2020-09-16 DIAGNOSIS — B182 Chronic viral hepatitis C: Secondary | ICD-10-CM

## 2020-11-23 IMAGING — US US ABDOMEN LIMITED
1 series · 14 of 25 positions shown · non-contrast
Comparison: 01/21/2019

CLINICAL DATA: Hepatitis-C

EXAM:
ULTRASOUND ABDOMEN LIMITED RIGHT UPPER QUADRANT

[Series 1: us abdomen limited · 0.23mm/px · 14 of 49 slices shown]
[im 1/49]
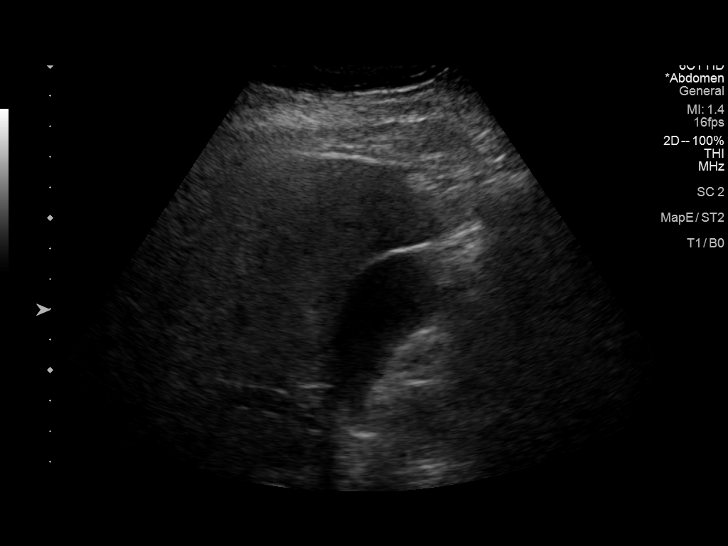
[im 5/49]
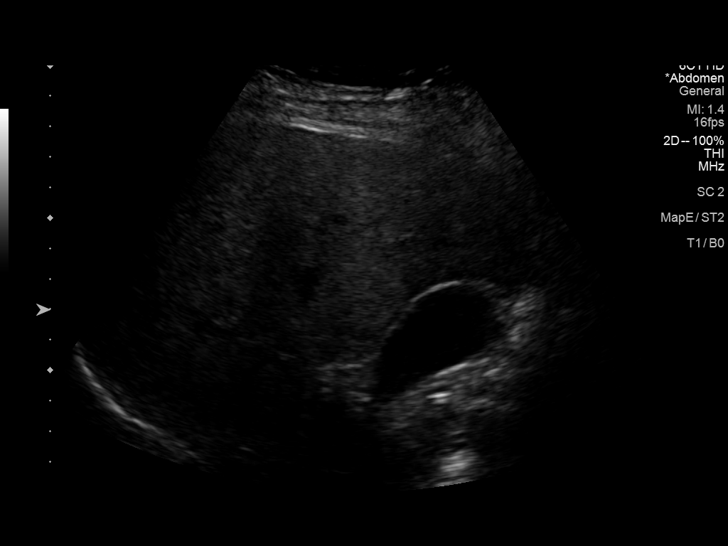
[im 9/49]
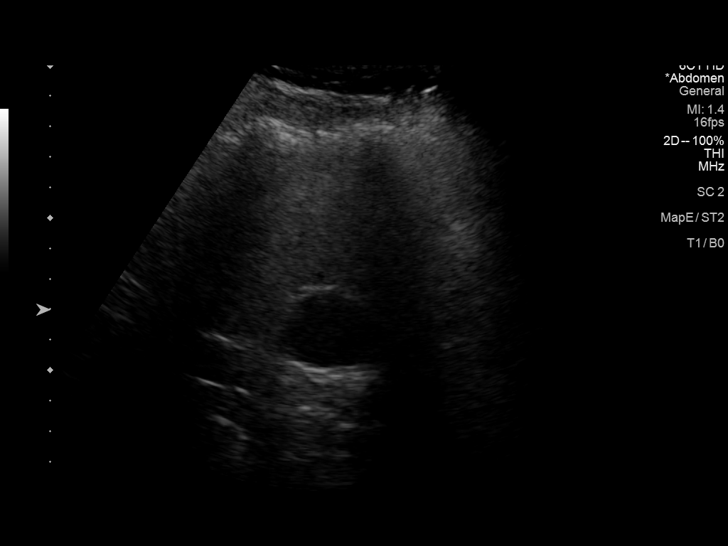
[im 13/49]
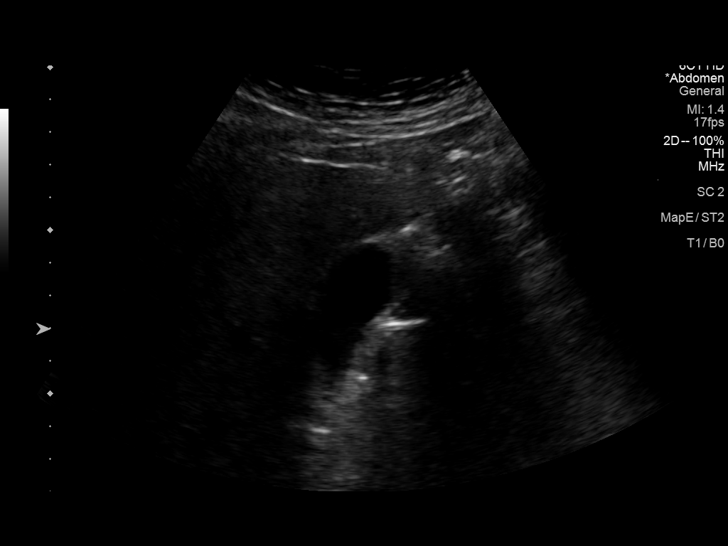
[im 17/49]
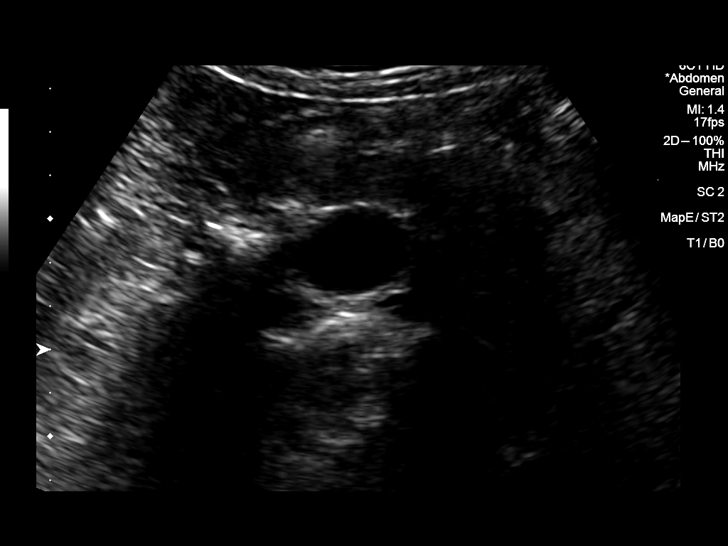
[im 19/49]
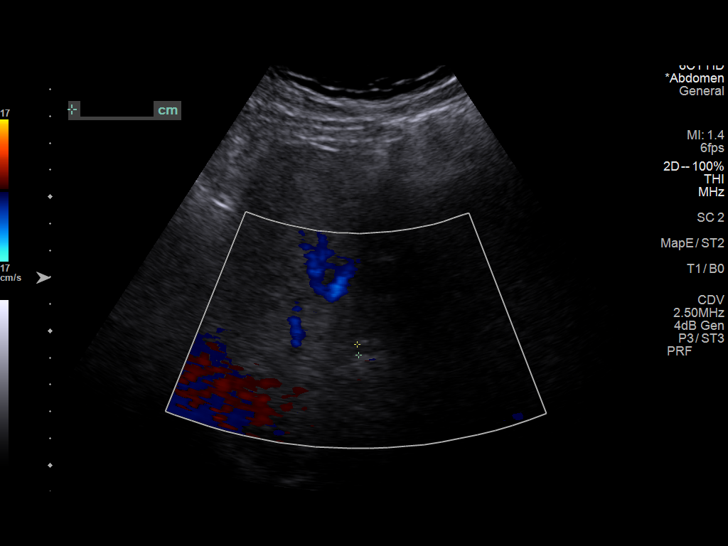
[im 23/49]
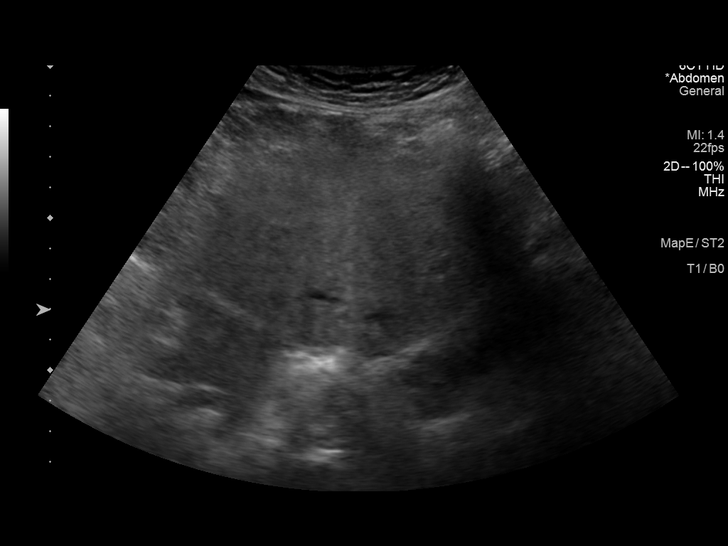
[im 27/49]
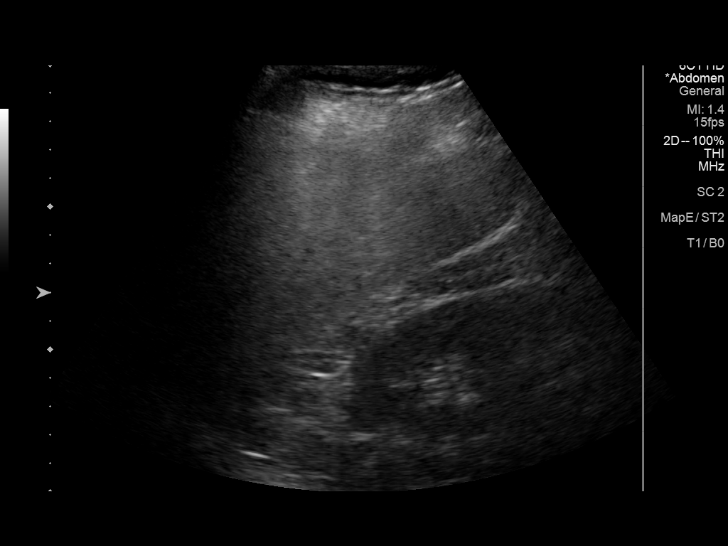
[im 31/49]
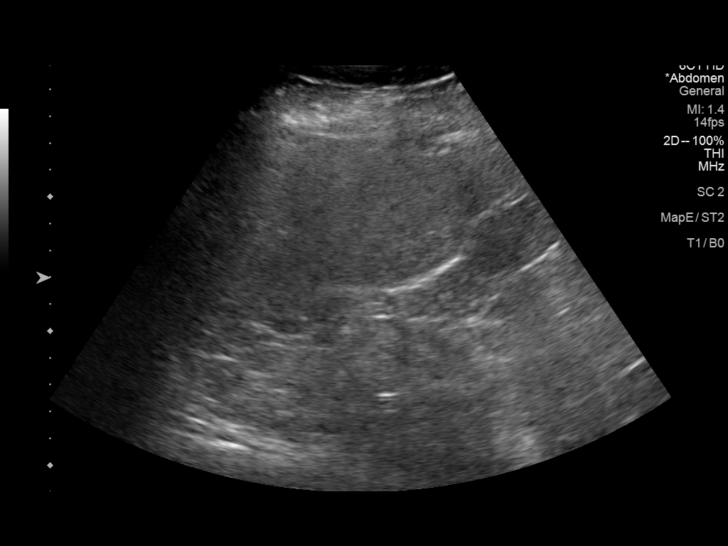
[im 33/49]
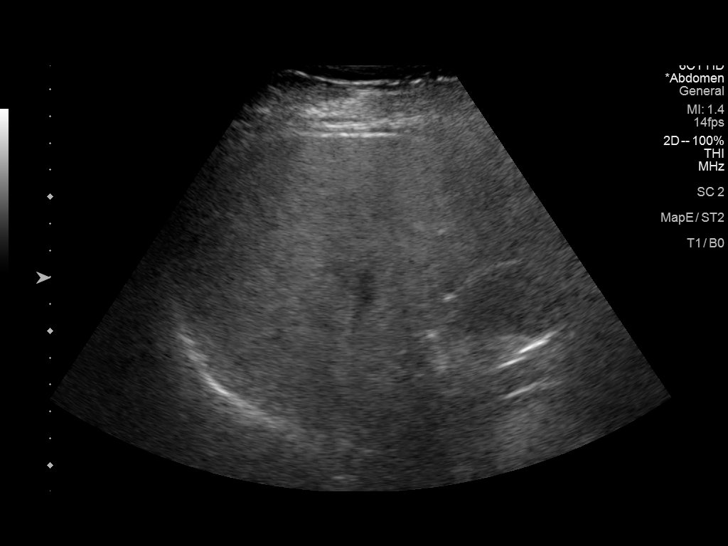
[im 37/49]
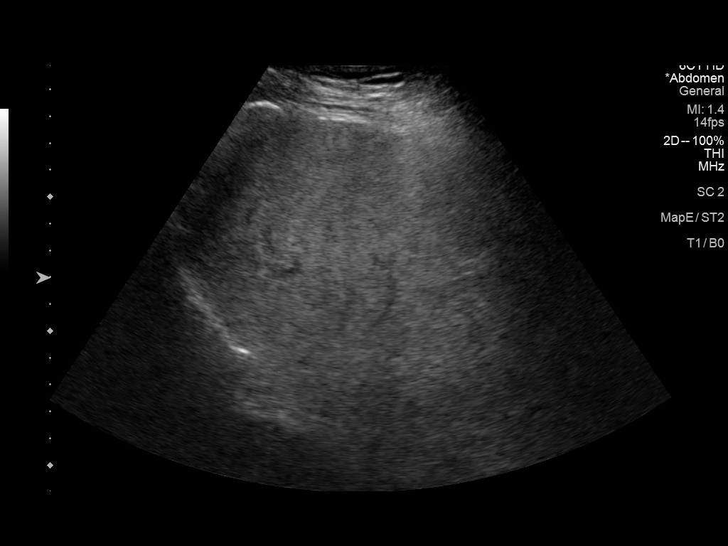
[im 41/49]
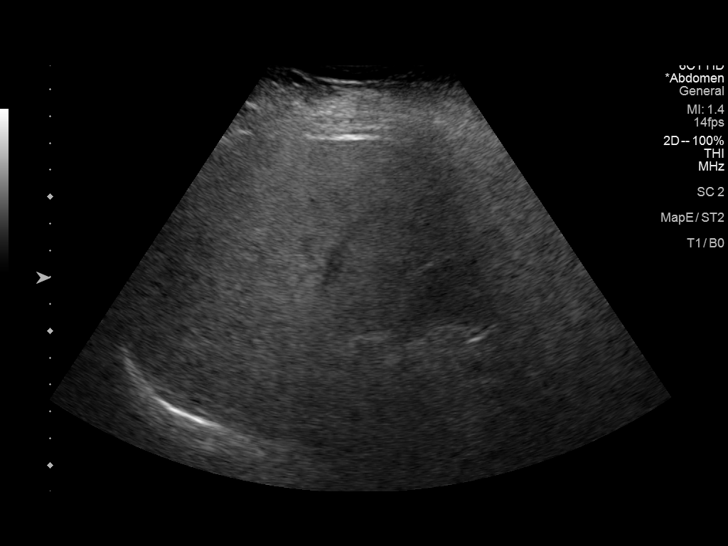
[im 45/49]
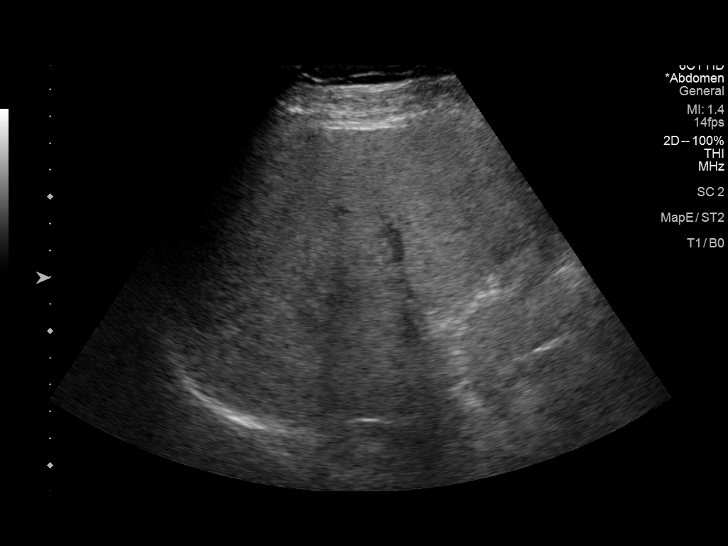
[im 49/49]
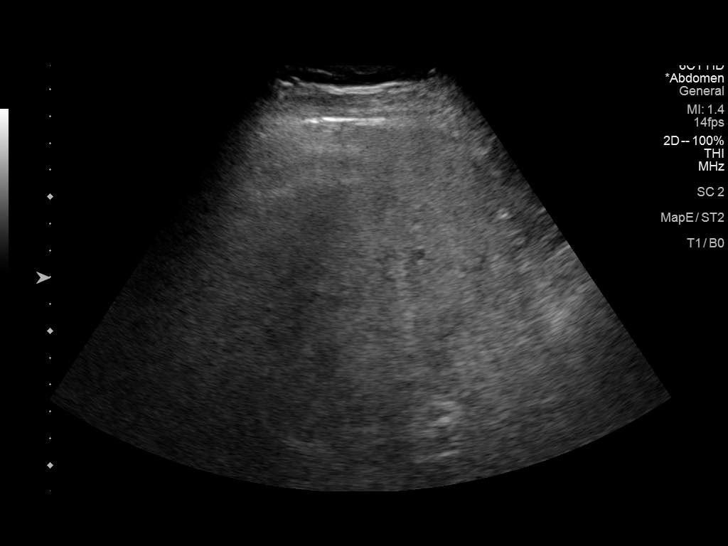

[14 of 25 positions shown; findings below may reference images not displayed]

FINDINGS: Gallbladder:

No gallstones or wall thickening visualized. No sonographic Murphy
sign noted by sonographer.

Common bile duct:

Diameter: 4 mm

Liver:

Diffuse increased echogenicity is noted. Mild nodularity is seen
suggestive of cirrhosis. No underlying mass lesion is noted. Portal
vein is patent on color Doppler imaging with normal direction of
blood flow towards the liver.

Other: None.
IMPRESSION: Stable changes in the liver suggestive of cirrhosis.

## 2020-12-13 IMAGING — DX DG CHEST 2V
2 series · 2 of 2 positions shown · non-contrast
Comparison: 10/14/2008

CLINICAL DATA: COPD/emphysema follow-up.

EXAM:
CHEST - 2 VIEW

[chest pa]
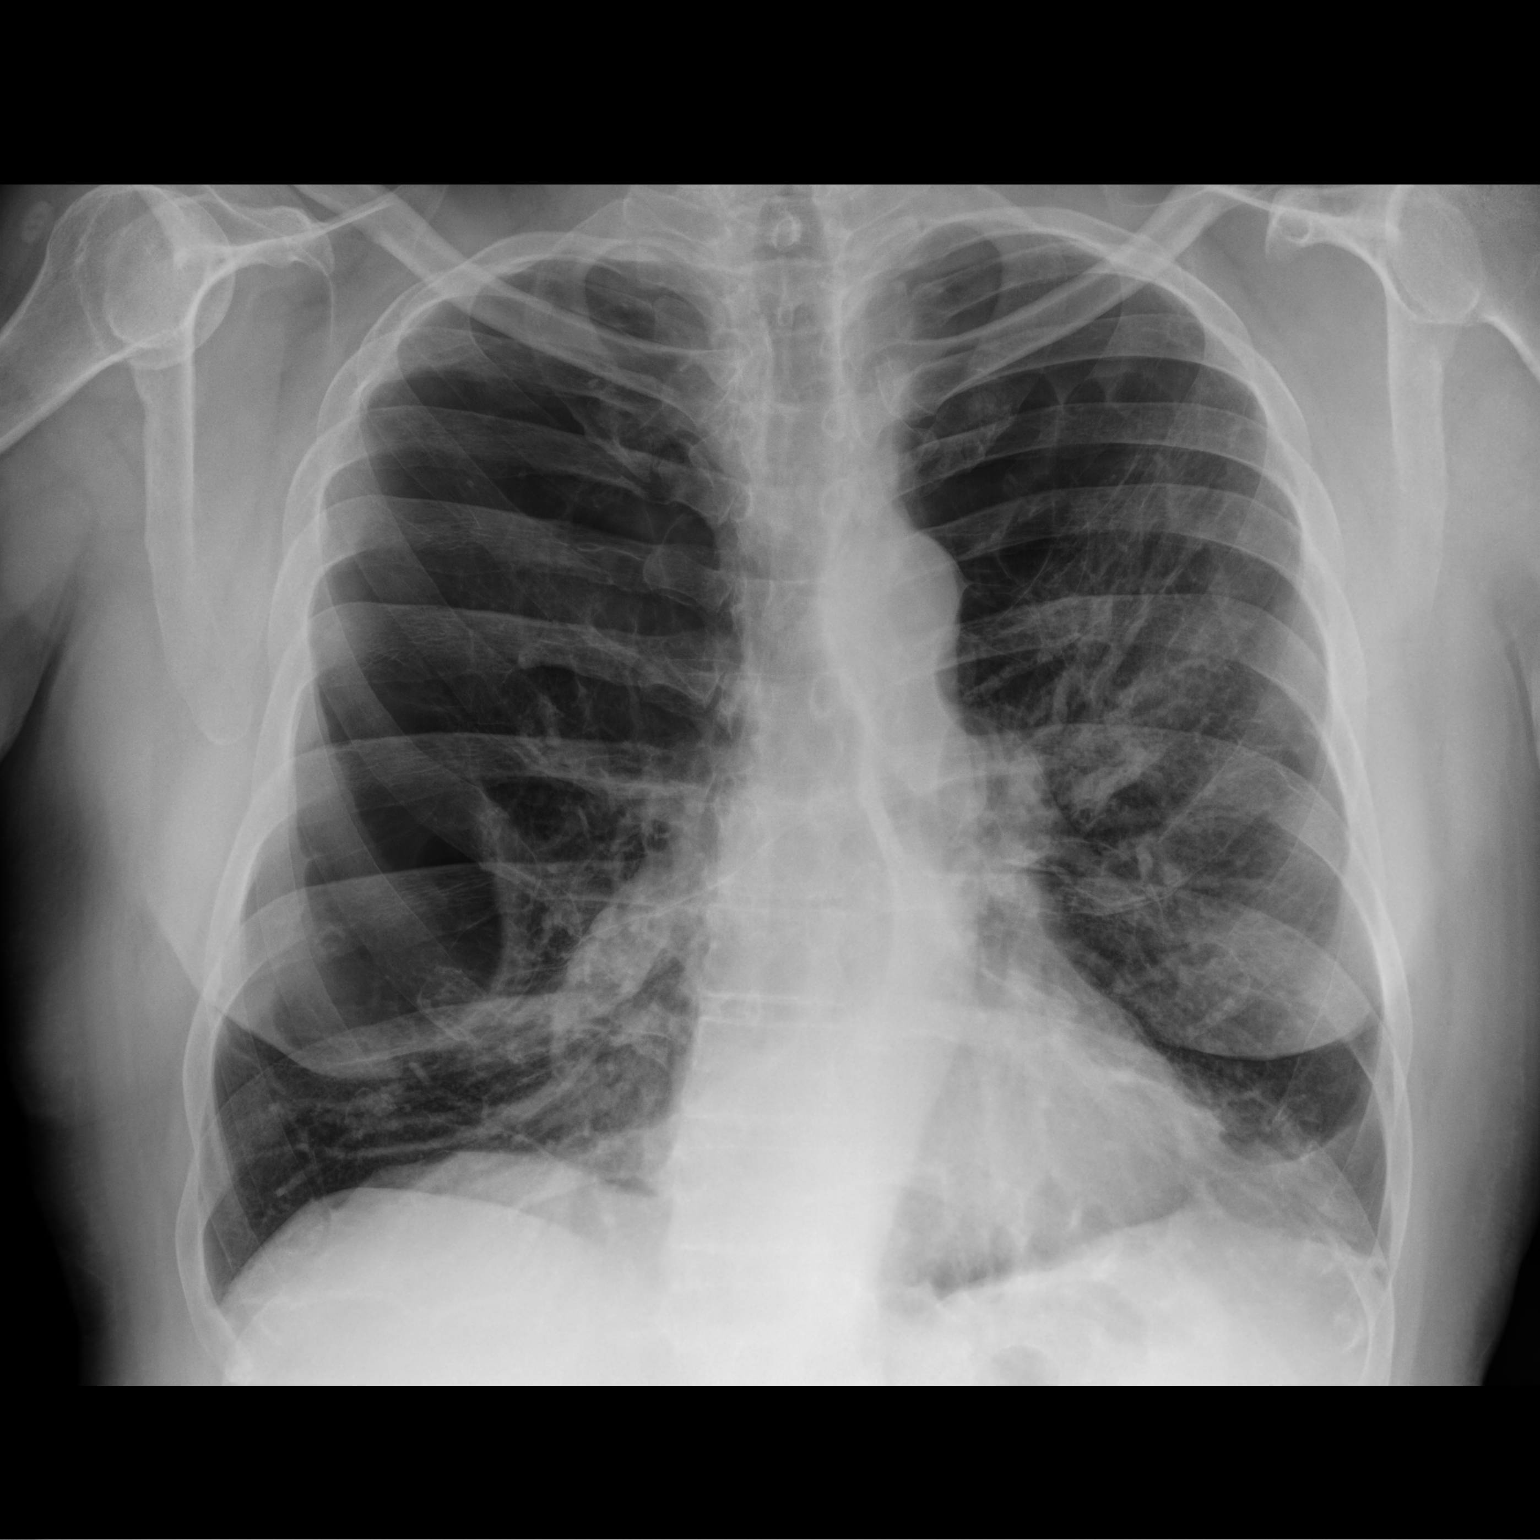

[chest lat]
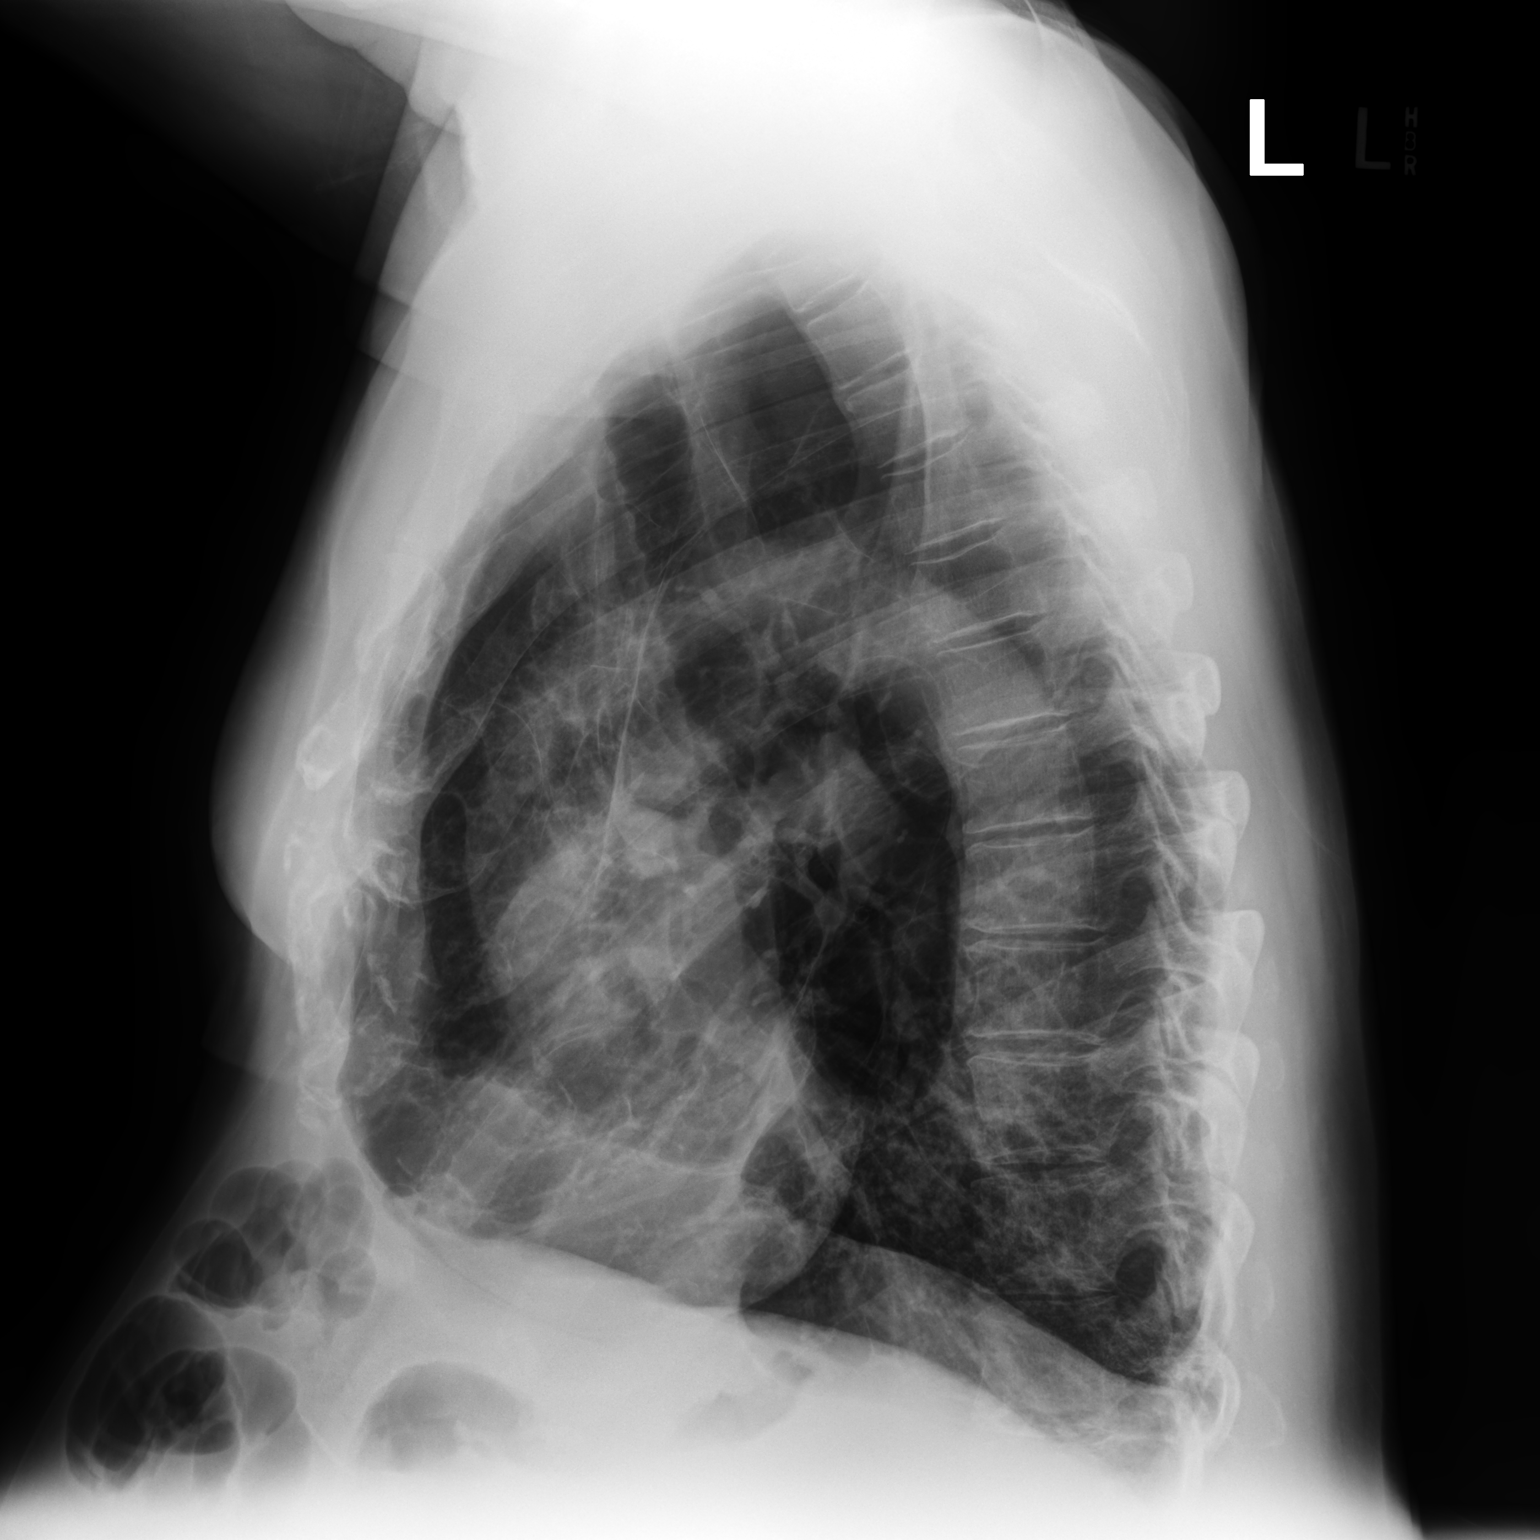

[2 of 2 positions shown; findings below may reference images not displayed]

FINDINGS: Lungs are adequately inflated demonstrate bullous emphysematous
disease right worse than left without significant change. No focal
airspace consolidation or effusion. Cardiomediastinal silhouette and
remainder the exam is unchanged.
IMPRESSION: No active cardiopulmonary disease.

Emphysematous disease.

## 2020-12-22 DIAGNOSIS — L821 Other seborrheic keratosis: Secondary | ICD-10-CM | POA: Diagnosis not present

## 2020-12-22 DIAGNOSIS — L820 Inflamed seborrheic keratosis: Secondary | ICD-10-CM | POA: Diagnosis not present

## 2020-12-22 DIAGNOSIS — Z85828 Personal history of other malignant neoplasm of skin: Secondary | ICD-10-CM | POA: Diagnosis not present

## 2021-01-24 DIAGNOSIS — B182 Chronic viral hepatitis C: Secondary | ICD-10-CM | POA: Diagnosis not present

## 2021-01-24 DIAGNOSIS — J449 Chronic obstructive pulmonary disease, unspecified: Secondary | ICD-10-CM | POA: Diagnosis not present

## 2021-01-24 DIAGNOSIS — I10 Essential (primary) hypertension: Secondary | ICD-10-CM | POA: Diagnosis not present

## 2021-01-24 DIAGNOSIS — Z0000 Encounter for general adult medical examination without abnormal findings: Secondary | ICD-10-CM | POA: Diagnosis not present

## 2021-01-24 DIAGNOSIS — Z86010 Personal history of colonic polyps: Secondary | ICD-10-CM | POA: Diagnosis not present

## 2021-01-24 DIAGNOSIS — Z23 Encounter for immunization: Secondary | ICD-10-CM | POA: Diagnosis not present

## 2021-01-24 DIAGNOSIS — Z125 Encounter for screening for malignant neoplasm of prostate: Secondary | ICD-10-CM | POA: Diagnosis not present

## 2021-01-24 DIAGNOSIS — K7460 Unspecified cirrhosis of liver: Secondary | ICD-10-CM | POA: Diagnosis not present

## 2021-02-01 ENCOUNTER — Telehealth: Payer: Self-pay | Admitting: Pulmonary Disease

## 2021-02-01 DIAGNOSIS — J449 Chronic obstructive pulmonary disease, unspecified: Secondary | ICD-10-CM

## 2021-02-01 NOTE — Telephone Encounter (Signed)
Per Dr Valeta Harms it is okay to go head and place referral for Pulmonary rehab. Order placed nothing further needed.

## 2021-02-01 NOTE — Telephone Encounter (Signed)
Hi Dr Valeta Harms I called and spoke to patient who is interested in pulmonary rehab if okay with you,  would you like me to place the order?

## 2021-02-07 ENCOUNTER — Telehealth (HOSPITAL_COMMUNITY): Payer: Self-pay

## 2021-02-07 NOTE — Telephone Encounter (Signed)
Pt called and stated he is interested in our [facility] program. Adv pt of our backlog of 1-4  months and we will contact him at a later date to go over insurance, patient verbalized understanding.

## 2021-02-07 NOTE — Telephone Encounter (Signed)
Will check insurance benefits closer to scheduling and/or into the new year 2023. 

## 2021-02-09 ENCOUNTER — Encounter (HOSPITAL_COMMUNITY): Payer: Self-pay | Admitting: *Deleted

## 2021-02-09 NOTE — Progress Notes (Signed)
Received referral from Dr. Elpidio Anis for this pt to participate in pulmonary rehab with the diagnosis of COPD Stage 3. Pt with PFT 09/2019 FEV1/FVC 53, post pred FEV1 39 Clinical review of pt follow up appt on 04/04/20  Pulmonary office note.  Pt with Covid Risk Score - 4. Pt appropriate for scheduling for Pulmonary rehab.  Will forward to support staff for scheduling and verification of insurance eligibility/benefits with pt consent. Cherre Huger, BSN Cardiac and Training and development officer

## 2021-03-01 ENCOUNTER — Encounter (HOSPITAL_COMMUNITY): Payer: Self-pay

## 2021-03-02 ENCOUNTER — Telehealth (HOSPITAL_COMMUNITY): Payer: Self-pay

## 2021-03-02 NOTE — Telephone Encounter (Signed)
Pt called back and he is interested in the pulmonary rehab program, I advised pt that we are booked for the month of January and that I will place him on the waiting list and will contact him when something comes available.

## 2021-03-10 ENCOUNTER — Other Ambulatory Visit: Payer: Self-pay | Admitting: Gastroenterology

## 2021-03-10 DIAGNOSIS — K7402 Hepatic fibrosis, advanced fibrosis: Secondary | ICD-10-CM | POA: Diagnosis not present

## 2021-03-10 DIAGNOSIS — K7469 Other cirrhosis of liver: Secondary | ICD-10-CM

## 2021-03-10 DIAGNOSIS — B182 Chronic viral hepatitis C: Secondary | ICD-10-CM | POA: Diagnosis not present

## 2021-03-10 DIAGNOSIS — Z86010 Personal history of colonic polyps: Secondary | ICD-10-CM | POA: Diagnosis not present

## 2021-03-14 ENCOUNTER — Ambulatory Visit
Admission: RE | Admit: 2021-03-14 | Discharge: 2021-03-14 | Disposition: A | Discharge status: Home / Self Care | Payer: PPO | Source: Ambulatory Visit | Attending: Gastroenterology | Admitting: Gastroenterology

## 2021-03-14 DIAGNOSIS — B182 Chronic viral hepatitis C: Secondary | ICD-10-CM | POA: Diagnosis not present

## 2021-03-14 DIAGNOSIS — K7469 Other cirrhosis of liver: Secondary | ICD-10-CM

## 2021-03-14 DIAGNOSIS — B1920 Unspecified viral hepatitis C without hepatic coma: Secondary | ICD-10-CM | POA: Diagnosis not present

## 2021-03-21 NOTE — Telephone Encounter (Signed)
No response from pt.  Closed referral  

## 2021-03-28 DIAGNOSIS — U071 COVID-19: Secondary | ICD-10-CM | POA: Diagnosis not present

## 2021-04-18 ENCOUNTER — Telehealth (HOSPITAL_COMMUNITY): Payer: Self-pay

## 2021-04-18 NOTE — Telephone Encounter (Signed)
Pt insurance is active and benefits verified through [employer]. Co-pay $15, DED 0/0 met, out of pocket $3,200/$100 met, co-insurance 0%. no pre-authorization required, [facility], REF# [id number] °

## 2021-05-10 ENCOUNTER — Other Ambulatory Visit: Payer: Self-pay

## 2021-05-10 ENCOUNTER — Ambulatory Visit (HOSPITAL_COMMUNITY): Payer: PPO

## 2021-05-10 ENCOUNTER — Encounter (HOSPITAL_COMMUNITY)
Admission: RE | Admit: 2021-05-10 | Discharge: 2021-05-10 | Disposition: A | Discharge status: Home / Self Care | Payer: PPO | Source: Ambulatory Visit | Attending: Pulmonary Disease | Admitting: Pulmonary Disease

## 2021-05-10 VITALS — BP 122/72 | Ht 70.5 in | Wt 210.8 lb

## 2021-05-10 DIAGNOSIS — J449 Chronic obstructive pulmonary disease, unspecified: Secondary | ICD-10-CM | POA: Insufficient documentation

## 2021-05-10 NOTE — Progress Notes (Signed)
Benjamin [name] 71 y.o. male ?Pulmonary Rehab Orientation Note ?This patient who was referred to Pulmonary Rehab by Dr. Valeta Harms with the diagnosis of Stage 3 COPD arrived today in Cardiac and Pulmonary Rehab. He arrived ambulatory with normal gait. He does not carry portable oxygen.  Per pt, Benjamin [name] uses oxygen never. Unfortunately he dropped his spo2 to 84% after walking 2 minutes 30 seconds. He was placed on 1L of oxygen. Color good, skin warm and dry. Patient is oriented to time and place. Patient's medical history, psychosocial health, and medications reviewed. Psychosocial assessment reveals pt lives with spouse. 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 Pt hobbies include fishing., boating, and traveling.  Pt reports his stress level is low, and reports no areas of stress. Pt does not exhibit signs of depression. PHQ2/9 score 0/0. Benjamin [name] shows good  coping skills with positive outlook on life. Offered emotional support and reassurance. Will continue to monitor  psychosocial for concerns while participating in pulmonary rehab . Physical assessment reveals heart rate is normal, breath sounds clear to auscultation, no wheezes, rales, or rhonchi. Grip strength equal, strong. Distal pulses present. Benjamin [name] reports he  does take medications as prescribed. Patient states he  follows a regular  diet. The patient reports no specific efforts to gain or lose weight.. Pt's weight will be monitored closely. Demonstration and practice of PLB using pulse oximeter. Benjamin [name] able to return demonstration satisfactorily. Safety and hand hygiene in the exercise area reviewed with patient. Benjamin [name] voices understanding of the information reviewed. Department expectations discussed with patient and achievable goals were set. The patient shows enthusiasm about attending the program and we look forward to working with Benjamin [name]. Benjamin [name] completed a 6 min walk test today and is scheduled to begin exercise on 05/10/2021 at 1:15 pm.   ? ?1015-1200 ?Benjamin [name] ?  ?

## 2021-05-10 NOTE — Progress Notes (Signed)
Benjamin [name] 71 y.o. male ? ?Initial Psychosocial Assessment ? ?Pt psychosocial assessment reveals pt lives with their spouse. Pt is currently retired as an Clinical biochemist. Pt hobbies include fishing., boating, and traveling. Pt reports his  stress level is low, he reports no areas of stress or anxiety.  Pt does not exhibit signs of depression. Pt shows good  coping skills with positive outlook . Offered emotional support and reassurance. Monitor and evaluate progress toward psychosocial goal(s). ? ?Goal(s): ?Improved management of COPD by practicing purse lip breathing ? ?Instruct re: oxygen use, he is new to supplemental oxygen along with safe exercise guidelines. ? ?Help patient work toward returning to meaningful activities that improve patient's QOL and are attainable with patient's lung disease by increasing strength and stamina ? ? ?05/10/2021 11:58 AM ?  ?

## 2021-05-10 NOTE — Progress Notes (Signed)
Pulmonary Individual Treatment Plan  Patient Details  Name: Benjamin [name] MRN: [id number] Date of Birth: 1951/01/19 Referring Provider:   April Manson Pulmonary Rehab Walk Test from 05/10/2021 in [place]  Referring Provider Icard       Initial Encounter Date:  Flowsheet Row Pulmonary Rehab Walk Test from 05/10/2021 in [place]  Date 05/10/21       Visit Diagnosis: Stage 3 severe COPD by GOLD classification ([place])  Patient's Home Medications on Admission:   Current Outpatient Medications:    albuterol (PROVENTIL) (2.5 MG/3ML) 0.083% nebulizer solution, Take 3 mLs (2.5 mg total) by nebulization every 4 (four) hours as needed for wheezing or shortness of breath., Disp: 75 mL, Rfl: 12   albuterol (VENTOLIN HFA) 108 (90 Base) MCG/ACT inhaler, INHALE 2 PUFFS INTO THE LUNGS EVERY 6 HOURS AS NEEDED FOR WHEEZING OR SHORTNESS OF BREATH, Disp: 8.5 g, Rfl: 5   alfuzosin (UROXATRAL) 10 MG 24 hr tablet, Take 10 mg by mouth daily with breakfast., Disp: , Rfl:    Ascorbic Acid (VITAMIN C) 1000 MG tablet, Take 1,000 mg by mouth daily., Disp: , Rfl:    aspirin 81 MG tablet, Take 81 mg by mouth daily., Disp: , Rfl:    ibuprofen (ADVIL,MOTRIN) 600 MG tablet, Take 1 tablet (600 mg total) by mouth every 8 (eight) hours as needed., Disp: 15 tablet, Rfl: 0   lisinopril (PRINIVIL,ZESTRIL) 20 MG tablet, Take 20 mg by mouth daily., Disp: , Rfl:    Multiple Vitamin (MULTIVITAMIN) tablet, Take 1 tablet by mouth daily., Disp: , Rfl:    CIALIS 5 MG tablet, Take 5 mg by mouth daily as needed for erectile dysfunction.  (Patient not taking: Reported on 05/10/2021), Disp: , Rfl:   Past Medical History: Past Medical History:  Diagnosis Date   BPH (benign prostatic hyperplasia)    COPD (chronic obstructive pulmonary disease) ([place])    Diverticulosis    ED (erectile dysfunction)    External hemorrhoid    Hypertension    Internal hemorrhoid    Kidney  stone    Personal history of colonic adenomas 05/15/2012    Tobacco Use: Social History   Tobacco Use  Smoking Status Former   Packs/day: 2.00   Years: 30.00   Pack years: 60.00   Types: Cigarettes   Quit date: 1999   Years since quitting: 24.1  Smokeless Tobacco Never    Labs: Recent Review Flowsheet Data   There is no flowsheet data to display.     Capillary Blood Glucose: No results found for: GLUCAP   Pulmonary Assessment Scores:  Pulmonary Assessment Scores     Row Name 05/10/21 1120         ADL UCSD   ADL Phase Entry     SOB Score total 7       CAT Score   CAT Score 10             UCSD: Self-administered rating of dyspnea associated with activities of daily living (ADLs) 6-point scale (0 = "not at all" to 5 = "maximal or unable to do because of breathlessness")  Scoring Scores range from 0 to 120.  Minimally important difference is 5 units  CAT: CAT can identify the health impairment of COPD patients and is better correlated with disease progression.  CAT has a scoring range of zero to 40. The CAT score is classified into four groups of low (less than 10), medium (10 -  20), high (21-30) and very high (31-40) based on the impact level of disease on health status. A CAT score over 10 suggests significant symptoms.  A worsening CAT score could be explained by an exacerbation, poor medication adherence, poor inhaler technique, or progression of COPD or comorbid conditions.  CAT MCID is 2 points  mMRC: mMRC (Modified Medical Research Council) Dyspnea Scale is used to assess the degree of baseline functional disability in patients of respiratory disease due to dyspnea. No minimal important difference is established. A decrease in score of 1 point or greater is considered a positive change.   Pulmonary Function Assessment:  Pulmonary Function Assessment - 05/10/21 1052       Breath   Bilateral Breath Sounds Clear    Shortness of Breath Limiting  activity;Yes             Exercise Target Goals: Exercise Program Goal: Individual exercise prescription set using results from initial 6 min walk test and THRR while considering  patients activity barriers and safety.   Exercise Prescription Goal: Initial exercise prescription builds to 30-45 minutes a day of aerobic activity, 2-3 days per week.  Home exercise guidelines will be given to patient during program as part of exercise prescription that the participant will acknowledge.  Activity Barriers & Risk Stratification:  Activity Barriers & Cardiac Risk Stratification - 05/10/21 1051       Activity Barriers & Cardiac Risk Stratification   Activity Barriers Deconditioning;Muscular Weakness;Shortness of Breath             6 Minute Walk:  6 Minute Walk     Row Name 05/10/21 1139         6 Minute Walk   Phase Initial     Distance 1000 feet     Walk Time 6 minutes     # of Rest Breaks 1  2:30-3:10 to put on O2     MPH 1.89     METS 2.45     RPE 9     Perceived Dyspnea  1     VO2 Peak 8.56     Symptoms No     Resting HR 60 bpm     Resting BP 122/72     Resting Oxygen Saturation  92 %     Exercise Oxygen Saturation  during 6 min walk 84 %     Max Ex. HR 97 bpm     Max Ex. BP 142/78     2 Minute Post BP 126/78       Interval HR   1 Minute HR 88     2 Minute HR 97     3 Minute HR 83     4 Minute HR 87     5 Minute HR 93     6 Minute HR 76     2 Minute Post HR 63     Interval Heart Rate? Yes       Interval Oxygen   Interval Oxygen? Yes     Baseline Oxygen Saturation % 92 %     1 Minute Oxygen Saturation % 87 %     1 Minute Liters of Oxygen 0 L     2 Minute Oxygen Saturation % 86 %  2:30: 84%     2 Minute Liters of Oxygen 0 L  Increased to 1L after 2:30     3 Minute Oxygen Saturation % 88 %     3 Minute Liters of Oxygen 1 L  4 Minute Oxygen Saturation % 90 %     4 Minute Liters of Oxygen 1 L     5 Minute Oxygen Saturation % 88 %     5 Minute  Liters of Oxygen 1 L     6 Minute Oxygen Saturation % 88 %     6 Minute Liters of Oxygen 1 L     2 Minute Post Oxygen Saturation % 94 %     2 Minute Post Liters of Oxygen 1 L              Oxygen Initial Assessment:  Oxygen Initial Assessment - 05/10/21 1052       Home Oxygen   Home Oxygen Device None    Home Exercise Oxygen Prescription None    Home Resting Oxygen Prescription None    Compliance with Home Oxygen Use Yes      Initial 6 min Walk   Oxygen Used Continuous    Liters per minute 1      Program Oxygen Prescription   Program Oxygen Prescription Continuous    Liters per minute 1      Intervention   Short Term Goals To learn and exhibit compliance with exercise, home and travel O2 prescription;To learn and understand importance of monitoring SPO2 with pulse oximeter and demonstrate accurate use of the pulse oximeter.;To learn and understand importance of maintaining oxygen saturations>88%;To learn and demonstrate proper pursed lip breathing techniques or other breathing techniques. ;To learn and demonstrate proper use of respiratory medications    Long  Term Goals Exhibits compliance with exercise, home  and travel O2 prescription;Verbalizes importance of monitoring SPO2 with pulse oximeter and return demonstration;Maintenance of O2 saturations>88%;Exhibits proper breathing techniques, such as pursed lip breathing or other method taught during program session;Compliance with respiratory medication;Demonstrates proper use of MDIs             Oxygen Re-Evaluation:   Oxygen Discharge (Final Oxygen Re-Evaluation):   Initial Exercise Prescription:  Initial Exercise Prescription - 05/10/21 1100       Date of Initial Exercise RX and Referring Provider   Date 05/10/21    Referring Provider Icard    Expected Discharge Date 07/13/21      Oxygen   Oxygen Continuous    Liters 1    Maintain Oxygen Saturation 88% or higher      NuStep   Level 1    SPM 70     Minutes 15      Arm Ergometer   Level 1    Watts 5    Minutes 15      Prescription Details   Frequency (times per week) 2    Duration Progress to 30 minutes of continuous aerobic without signs/symptoms of physical distress      Intensity   THRR 40-80% of Max Heartrate 61-122    Ratings of Perceived Exertion 11-13    Perceived Dyspnea 0-4      Progression   Progression Continue to progress workloads to maintain intensity without signs/symptoms of physical distress.      Resistance Training   Training Prescription Yes    Weight blue bands    Reps 10-15             Perform Capillary Blood Glucose checks as needed.  Exercise Prescription Changes:   Exercise Comments:   Exercise Goals and Review:   Exercise Goals     Row Name 05/10/21 1044  Exercise Goals   Increase Physical Activity Yes       Intervention Provide advice, education, support and counseling about physical activity/exercise needs.;Develop an individualized exercise prescription for aerobic and resistive training based on initial evaluation findings, risk stratification, comorbidities and participant's personal goals.       Expected Outcomes Short Term: Attend rehab on a regular basis to increase amount of physical activity.;Long Term: Add in home exercise to make exercise part of routine and to increase amount of physical activity.;Long Term: Exercising regularly at least 3-5 days a week.       Increase Strength and Stamina Yes       Intervention Provide advice, education, support and counseling about physical activity/exercise needs.;Develop an individualized exercise prescription for aerobic and resistive training based on initial evaluation findings, risk stratification, comorbidities and participant's personal goals.       Expected Outcomes Short Term: Increase workloads from initial exercise prescription for resistance, speed, and METs.;Short Term: Perform resistance training exercises  routinely during rehab and add in resistance training at home;Long Term: Improve cardiorespiratory fitness, muscular endurance and strength as measured by increased METs and functional capacity (6MWT)       Able to understand and use rate of perceived exertion (RPE) scale Yes       Intervention Provide education and explanation on how to use RPE scale       Expected Outcomes Short Term: Able to use RPE daily in rehab to express subjective intensity level;Long Term:  Able to use RPE to guide intensity level when exercising independently       Able to understand and use Dyspnea scale Yes       Intervention Provide education and explanation on how to use Dyspnea scale       Expected Outcomes Short Term: Able to use Dyspnea scale daily in rehab to express subjective sense of shortness of breath during exertion;Long Term: Able to use Dyspnea scale to guide intensity level when exercising independently       Knowledge and understanding of Target Heart Rate Range (THRR) Yes       Intervention Provide education and explanation of THRR including how the numbers were predicted and where they are located for reference       Expected Outcomes Short Term: Able to state/look up THRR;Long Term: Able to use THRR to govern intensity when exercising independently;Short Term: Able to use daily as guideline for intensity in rehab       Understanding of Exercise Prescription Yes       Intervention Provide education, explanation, and written materials on patient's individual exercise prescription       Expected Outcomes Short Term: Able to explain program exercise prescription;Long Term: Able to explain home exercise prescription to exercise independently                Exercise Goals Re-Evaluation :   Discharge Exercise Prescription (Final Exercise Prescription Changes):   Nutrition:  Target Goals: Understanding of nutrition guidelines, daily intake of sodium '1500mg'$ , cholesterol '200mg'$ , calories 30% from fat  and 7% or less from saturated fats, daily to have 5 or more servings of fruits and vegetables.  Biometrics:  Pre Biometrics - 05/10/21 1051       Pre Biometrics   Height 5' 10.5" (1.791 m)    Weight 95.6 kg    BMI (Calculated) 29.8    Grip Strength 20 kg              Nutrition Therapy Plan  and Nutrition Goals:   Nutrition Assessments:  MEDIFICTS Score Key: ?70 Need to make dietary changes  40-70 Heart Healthy Diet ? 40 Therapeutic Level Cholesterol Diet   Picture Your Plate Scores: <19 Unhealthy dietary pattern with much room for improvement. 41-50 Dietary pattern unlikely to meet recommendations for good health and room for improvement. 51-60 More healthful dietary pattern, with some room for improvement.  >60 Healthy dietary pattern, although there may be some specific behaviors that could be improved.    Nutrition Goals Re-Evaluation:   Nutrition Goals Discharge (Final Nutrition Goals Re-Evaluation):   Psychosocial: Target Goals: Acknowledge presence or absence of significant depression and/or stress, maximize coping skills, provide positive support system. Participant is able to verbalize types and ability to use techniques and skills needed for reducing stress and depression.  Initial Review & Psychosocial Screening:  Initial Psych Review & Screening - 05/10/21 1123       Initial Review   Current issues with None Identified      Family Dynamics   Good Support System? Yes   wife and children   Comments No psychosocial concerns identified at this time.      Barriers   Psychosocial barriers to participate in program There are no identifiable barriers or psychosocial needs.      Screening Interventions   Interventions Encouraged to exercise    Expected Outcomes Long Term Goal: Stressors or current issues are controlled or eliminated.             Quality of Life Scores:  Scores of 19 and below usually indicate a poorer quality of life in these  areas.  A difference of  2-3 points is a clinically meaningful difference.  A difference of 2-3 points in the total score of the Quality of Life Index has been associated with significant improvement in overall quality of life, self-image, physical symptoms, and general health in studies assessing change in quality of life.  PHQ-9: Recent Review Flowsheet Data     Depression screen [facility] 2/9 05/10/2021   Decreased Interest 0   Down, Depressed, Hopeless 0   PHQ - 2 Score 0   Altered sleeping 0   Tired, decreased energy 0   Change in appetite 0   Feeling bad or failure about yourself  0   Trouble concentrating 0   Moving slowly or fidgety/restless 0   Suicidal thoughts 0   PHQ-9 Score 0   Difficult doing work/chores Not difficult at all      Interpretation of Total Score  Total Score Depression Severity:  1-4 = Minimal depression, 5-9 = Mild depression, 10-14 = Moderate depression, 15-19 = Moderately severe depression, 20-27 = Severe depression   Psychosocial Evaluation and Intervention:  Psychosocial Evaluation - 05/10/21 1129       Psychosocial Evaluation & Interventions   Interventions Encouraged to exercise with the program and follow exercise prescription    Comments No psychosocial concerns identified at this time    Expected Outcomes For [place] to continue to be free of psychosocial concerns while participating in pulmonary rehab.    Continue Psychosocial Services  No Follow up required             Psychosocial Re-Evaluation:   Psychosocial Discharge (Final Psychosocial Re-Evaluation):   Education: Education Goals: Education classes will be provided on a weekly basis, covering required topics. Participant will state understanding/return demonstration of topics presented.  Learning Barriers/Preferences:  Learning Barriers/Preferences - 05/10/21 1131       Learning Barriers/Preferences  Learning Barriers None    Learning Preferences  Audio;Computer/Internet;Group Instruction;Individual Instruction;Pictoral;Skilled Demonstration;Verbal Instruction;Written Material             Education Topics: Risk Factor Reduction:  -Group instruction that is supported by a PowerPoint presentation. Instructor discusses the definition of a risk factor, different risk factors for pulmonary disease, and how the heart and lungs work together.     Nutrition for Pulmonary Patient:  -Group instruction provided by PowerPoint slides, verbal discussion, and written materials to support subject matter. The instructor gives an explanation and review of healthy diet recommendations, which includes a discussion on weight management, recommendations for fruit and vegetable consumption, as well as protein, fluid, caffeine, fiber, sodium, sugar, and alcohol. Tips for eating when patients are short of breath are discussed.   Pursed Lip Breathing:  -Group instruction that is supported by demonstration and informational handouts. Instructor discusses the benefits of pursed lip and diaphragmatic breathing and detailed demonstration on how to preform both.     Oxygen Safety:  -Group instruction provided by PowerPoint, verbal discussion, and written material to support subject matter. There is an overview of What is Oxygen and Why do we need it.  Instructor also reviews how to create a safe environment for oxygen use, the importance of using oxygen as prescribed, and the risks of noncompliance. There is a brief discussion on traveling with oxygen and resources the patient may utilize.   Oxygen Equipment:  -Group instruction provided by [facility] Staff utilizing handouts, written materials, and equipment demonstrations.   Signs and Symptoms:  -Group instruction provided by written material and verbal discussion to support subject matter. Warning signs and symptoms of infection, stroke, and heart attack are reviewed and when to call the physician/911  reinforced. Tips for preventing the spread of infection discussed.   Advanced Directives:  -Group instruction provided by verbal instruction and written material to support subject matter. Instructor reviews Advanced Directive laws and proper instruction for filling out document.   Pulmonary Video:  -Group video education that reviews the importance of medication and oxygen compliance, exercise, good nutrition, pulmonary hygiene, and pursed lip and diaphragmatic breathing for the pulmonary patient.   Exercise for the Pulmonary Patient:  -Group instruction that is supported by a PowerPoint presentation. Instructor discusses benefits of exercise, core components of exercise, frequency, duration, and intensity of an exercise routine, importance of utilizing pulse oximetry during exercise, safety while exercising, and options of places to exercise outside of rehab.     Pulmonary Medications:  -Verbally interactive group education provided by instructor with focus on inhaled medications and proper administration.   Anatomy and Physiology of the Respiratory System and Intimacy:  -Group instruction provided by PowerPoint, verbal discussion, and written material to support subject matter. Instructor reviews respiratory cycle and anatomical components of the respiratory system and their functions. Instructor also reviews differences in obstructive and restrictive respiratory diseases with examples of each. Intimacy, Sex, and Sexuality differences are reviewed with a discussion on how relationships can change when diagnosed with pulmonary disease. Common sexual concerns are reviewed.   MD DAY -A group question and answer session with a medical doctor that allows participants to ask questions that relate to their pulmonary disease state.   OTHER EDUCATION -Group or individual verbal, written, or video instructions that support the educational goals of the pulmonary rehab program.   Holiday Eating  Survival Tips:  -Group instruction provided by PowerPoint slides, verbal discussion, and written materials to support subject matter. The instructor gives  patients tips, tricks, and techniques to help them not only survive but enjoy the holidays despite the onslaught of food that accompanies the holidays.   Knowledge Questionnaire Score:  Knowledge Questionnaire Score - 05/10/21 1121       Knowledge Questionnaire Score   Pre Score 12/18             Core Components/Risk Factors/Patient Goals at Admission:  Personal Goals and Risk Factors at Admission - 05/10/21 1122       Core Components/Risk Factors/Patient Goals on Admission   Improve shortness of breath with ADL's Yes    Intervention Provide education, individualized exercise plan and daily activity instruction to help decrease symptoms of SOB with activities of daily living.    Expected Outcomes Short Term: Improve cardiorespiratory fitness to achieve a reduction of symptoms when performing ADLs;Long Term: Be able to perform more ADLs without symptoms or delay the onset of symptoms    Increase knowledge of respiratory medications and ability to use respiratory devices properly  Yes    Intervention Provide education and demonstration as needed of appropriate use of medications, inhalers, and oxygen therapy.    Expected Outcomes Short Term: Achieves understanding of medications use. Understands that oxygen is a medication prescribed by physician. Demonstrates appropriate use of inhaler and oxygen therapy.;Long Term: Maintain appropriate use of medications, inhalers, and oxygen therapy.             Core Components/Risk Factors/Patient Goals Review:   Goals and Risk Factor Review     Row Name 05/10/21 1122             Core Components/Risk Factors/Patient Goals Review   Personal Goals Review Develop more efficient breathing techniques such as purse lipped breathing and diaphragmatic breathing and practicing self-pacing with  activity.;Increase knowledge of respiratory medications and ability to use respiratory devices properly.;Improve shortness of breath with ADL's                Core Components/Risk Factors/Patient Goals at Discharge (Final Review):   Goals and Risk Factor Review - 05/10/21 1122       Core Components/Risk Factors/Patient Goals Review   Personal Goals Review Develop more efficient breathing techniques such as purse lipped breathing and diaphragmatic breathing and practicing self-pacing with activity.;Increase knowledge of respiratory medications and ability to use respiratory devices properly.;Improve shortness of breath with ADL's             ITP Comments:   Comments: Dr. Rodman Pickle is Medical Director for Pulmonary Rehab at [facility].

## 2021-05-11 ENCOUNTER — Other Ambulatory Visit: Payer: Self-pay

## 2021-05-11 DIAGNOSIS — R0602 Shortness of breath: Secondary | ICD-10-CM

## 2021-05-11 NOTE — Progress Notes (Signed)
6 Minute Walk   ?  ?  [place] Name 05/10/21 1139   ?    ?   ?       ?  6 Minute Walk  ?  Phase Initial      ?  Distance 1000 feet      ?  Walk Time 6 minutes      ?  # of Rest Breaks 1  2:30-3:10 to put on O2      ?  MPH 1.89      ?  METS 2.45      ?  RPE 9      ?  Perceived Dyspnea  1      ?  VO2 Peak 8.56      ?  Symptoms No      ?  Resting HR 60 bpm      ?  Resting BP 122/72      ?  Resting Oxygen Saturation  92 %      ?  Exercise Oxygen Saturation  during 6 min walk 84 %      ?  Max Ex. HR 97 bpm      ?  Max Ex. BP 142/78      ?  2 Minute Post BP 126/78      ?       ?  Interval HR  ?  1 Minute HR 88      ?  2 Minute HR 97      ?  3 Minute HR 83      ?  4 Minute HR 87      ?  5 Minute HR 93      ?  6 Minute HR 76      ?  2 Minute Post HR 63      ?  Interval Heart Rate? Yes      ?       ?  Interval Oxygen  ?  Interval Oxygen? Yes      ?  Baseline Oxygen Saturation % 92 %      ?  1 Minute Oxygen Saturation % 87 %      ?  1 Minute Liters of Oxygen 0 L      ?  2 Minute Oxygen Saturation % 86 %  2:30: 84%      ?  2 Minute Liters of Oxygen 0 L  Increased to 1L after 2:30      ?  3 Minute Oxygen Saturation % 88 %      ?  3 Minute Liters of Oxygen 1 L      ?  4 Minute Oxygen Saturation % 90 %      ?  4 Minute Liters of Oxygen 1 L      ?  5 Minute Oxygen Saturation % 88 %      ?  5 Minute Liters of Oxygen 1 L      ?  6 Minute Oxygen Saturation % 88 %      ?  6 Minute Liters of Oxygen 1 L      ?  2 Minute Post Oxygen Saturation % 94 %      ?  2 Minute Post Liters of Oxygen 1 L      ?   ? ?

## 2021-05-12 ENCOUNTER — Telehealth: Payer: Self-pay | Admitting: Pulmonary Disease

## 2021-05-12 DIAGNOSIS — J9611 Chronic respiratory failure with hypoxia: Secondary | ICD-10-CM

## 2021-05-12 NOTE — Telephone Encounter (Signed)
[name], Benjamin [name]  [place], [place]; [place], [place]; [place], [place] ?When we contacted the patient to acknowledge the order, he stated he was unaware of the o2 being ordered and he wanted to speak with his doctor first about this before we continue with the order. We do have the patient keyed in our system and an order has been started. ?

## 2021-05-12 NOTE — Telephone Encounter (Signed)
Called and spoke with patient. He stated that someone from [place] called him about an oxygen order that was placed by Dr. Valeta Harms. He was not aware that he needed O2.  ? ?I looked in his chart and did not see a telephone or office visit discussing the O2. Order was placed yesterday.  ? ?BI, can you please advise? Thanks!  ?

## 2021-05-15 NOTE — Telephone Encounter (Signed)
5:42 PM ? The O2 orders came from pulmonary rehab  ? ?He desaturated with exertion  ? ?Garner Nash, DO  ?[facility]  ?05/12/2021 5:42 PM  ? ?  Called the pt and there was no answer- LMTCB ?

## 2021-05-16 ENCOUNTER — Encounter (HOSPITAL_COMMUNITY)
Admission: RE | Admit: 2021-05-16 | Discharge: 2021-05-16 | Disposition: A | Discharge status: Home / Self Care | Payer: PPO | Source: Ambulatory Visit | Attending: Pulmonary Disease | Admitting: Pulmonary Disease

## 2021-05-16 ENCOUNTER — Other Ambulatory Visit: Payer: Self-pay

## 2021-05-16 DIAGNOSIS — J449 Chronic obstructive pulmonary disease, unspecified: Secondary | ICD-10-CM | POA: Diagnosis not present

## 2021-05-16 NOTE — Progress Notes (Signed)
Daily Session Note ? ?Patient Details  ?Name: Benjamin [name] ?MRN: [id number] ?Date of Birth: 05-27-1950 ?Referring Provider:   ?Flowsheet Row Pulmonary Rehab Walk Test from 05/10/2021 in [place]  ?Referring Provider Icard  ? ?  ? ? ?Encounter Date: 05/16/2021 ? ?Check In: ? Session Check In - 05/16/21 1330   ? ?  ? Check-In  ? Supervising physician immediately available to respond to emergencies [facility] immediately available   ? Physician(s) Dr. Alfredia Ferguson   ? Location [facility]   ? Staff Present Elmon Else, MS, ACSM-CEP, Exercise Physiologist;Lisa Ysidro Evert, RN;Other   ? Virtual Visit No   ? Medication changes reported     No   ? Fall or balance concerns reported    No   ? Tobacco Cessation No Change   ? Warm-up and Cool-down Performed as group-led instruction   ? Resistance Training Performed Yes   ? VAD Patient? No   ? PAD/SET Patient? No   ?  ? Pain Assessment  ? Currently in Pain? No/denies   ? Multiple Pain Sites No   ? ?  ?  ? ?  ? ? ?Capillary Blood Glucose: ?No results found for this or any previous visit (from the past 24 hour(s)). ? ? ? ?Social History  ? ?Tobacco Use  ?Smoking Status Former  ? Packs/day: 2.00  ? Years: 30.00  ? Pack years: 60.00  ? Types: Cigarettes  ? Quit date: 1999  ? Years since quitting: 24.2  ?Smokeless Tobacco Never  ? ? ?Goals Met:  ?Independence with exercise equipment ?Exercise tolerated well ?No report of concerns or symptoms today ?Strength training completed today ? ?Goals Unmet:  ?Not Applicable ? ?Comments: Service time is from 1330 to 1440 ?.  ? ? ?Dr. Rodman Pickle is Medical Director for Pulmonary Rehab at [facility].  ?

## 2021-05-18 ENCOUNTER — Encounter (HOSPITAL_COMMUNITY)
Admission: RE | Admit: 2021-05-18 | Discharge: 2021-05-18 | Disposition: A | Discharge status: Home / Self Care | Payer: PPO | Source: Ambulatory Visit | Attending: Pulmonary Disease | Admitting: Pulmonary Disease

## 2021-05-18 ENCOUNTER — Other Ambulatory Visit: Payer: Self-pay

## 2021-05-18 DIAGNOSIS — J449 Chronic obstructive pulmonary disease, unspecified: Secondary | ICD-10-CM | POA: Diagnosis not present

## 2021-05-18 NOTE — Progress Notes (Signed)
Daily Session Note ? ?Patient Details  ?Name: Colburn W Cossey ?MRN: [id number] ?Date of Birth: 02/03/1951 ?Referring Provider:   ?Flowsheet Row Pulmonary Rehab Walk Test from 05/10/2021 in [facility]  ?Referring Provider Icard  ? ?  ? ? ?Encounter Date: 05/18/2021 ? ?Check In: ? Session Check In - 05/18/21 1335   ? ?  ? Check-In  ? Supervising physician immediately available to respond to emergencies [facility] immediately available   ? Physician(s) Dr Gherghe   ? Location [facility]   ? Staff Present Joan Behrens, RN, BSN;David Makemson, MS, ACSM-CEP, CCRP, Exercise Physiologist;Kaylee Davis, MS, ACSM-CEP, Exercise Physiologist;Other   ? Virtual Visit No   ? Medication changes reported     No   ? Fall or balance concerns reported    No   ? Tobacco Cessation No Change   ? Warm-up and Cool-down Performed as group-led instruction   ? Resistance Training Performed Yes   ? VAD Patient? No   ? PAD/SET Patient? No   ?  ? Pain Assessment  ? Currently in Pain? No/denies   ? Multiple Pain Sites No   ? ?  ?  ? ?  ? ? ?Capillary Blood Glucose: ?No results found for this or any previous visit (from the past 24 hour(s)). ? ? ? ?Social History  ? ?Tobacco Use  ?Smoking Status Former  ? Packs/day: 2.00  ? Years: 30.00  ? Pack years: 60.00  ? Types: Cigarettes  ? Quit date: 1999  ? Years since quitting: 24.2  ?Smokeless Tobacco Never  ? ? ?Goals Met:  ?Proper associated with RPD/PD & O2 Sat ?Exercise tolerated well ?No report of concerns or symptoms today ?Strength training completed today ? ?Goals Unmet:  ?Not Applicable ? ?Comments: Service time is from 1320 to 1430 ? ? ? ?Dr. Jane Ellison is Medical Director for Pulmonary Rehab at [facility].  ?

## 2021-05-23 ENCOUNTER — Other Ambulatory Visit: Payer: Self-pay

## 2021-05-23 ENCOUNTER — Encounter (HOSPITAL_COMMUNITY)
Admission: RE | Admit: 2021-05-23 | Discharge: 2021-05-23 | Disposition: A | Discharge status: Home / Self Care | Payer: PPO | Source: Ambulatory Visit | Attending: Pulmonary Disease | Admitting: Pulmonary Disease

## 2021-05-23 VITALS — Wt 208.8 lb

## 2021-05-23 DIAGNOSIS — J449 Chronic obstructive pulmonary disease, unspecified: Secondary | ICD-10-CM

## 2021-05-23 NOTE — Progress Notes (Signed)
Daily Session Note ? ?Patient Details  ?Name: Benjamin [name] ?MRN: [id number] ?Date of Birth: 1950-05-21 ?Referring Provider:   ?Flowsheet Row Pulmonary Rehab Walk Test from 05/10/2021 in [facility]  ?Referring Provider Icard  ? ?  ? ? ?Encounter Date: 05/23/2021 ? ?Check In: ? Session Check In - 05/23/21 1424   ? ?  ? Check-In  ? Supervising physician immediately available to respond to emergencies [facility] immediately available   ? Physician(s) Dr. Cruzita Lederer   ? Location [facility]   ? Staff Present Rosebud Poles, RN, Quentin Ore, MS, ACSM-CEP, Exercise Physiologist;Carlette Wilber Oliphant, RN, Deland Pretty, MS, ACSM CEP, Exercise Physiologist   ? Virtual Visit No   ? Medication changes reported     No   ? Fall or balance concerns reported    No   ? Tobacco Cessation No Change   ? Warm-up and Cool-down Performed as group-led instruction   ? Resistance Training Performed Yes   ? VAD Patient? No   ? PAD/SET Patient? No   ?  ? Pain Assessment  ? Currently in Pain? No/denies   ? Multiple Pain Sites No   ? ?  ?  ? ?  ? ? ?Capillary Blood Glucose: ?No results found for this or any previous visit (from the past 24 hour(s)). ? ? Exercise Prescription Changes - 05/23/21 1500   ? ?  ? Response to Exercise  ? Blood Pressure (Admit) 120/70   ? Blood Pressure (Exercise) 138/86   ? Blood Pressure (Exit) 104/68   ? Heart Rate (Admit) 68 bpm   ? Heart Rate (Exercise) 103 bpm   ? Heart Rate (Exit) 96 bpm   ? Oxygen Saturation (Admit) 95 %   ? Oxygen Saturation (Exercise) 91 %   ? Oxygen Saturation (Exit) 96 %   ? Rating of Perceived Exertion (Exercise) 11   ? Perceived Dyspnea (Exercise) 1   ? Duration Continue with 30 min of aerobic exercise without signs/symptoms of physical distress.   ? Intensity --   40-80% HRR  ?  ? Resistance Training  ? Training Prescription Yes   ? Weight blue bands   ? Reps 10-15   ? Time 10 Minutes   ?  ? Oxygen  ? Oxygen Continuous   ? Liters 1    ?  ? NuStep  ? Level 3   ? SPM 80   ? Minutes 15   ? METs 1.8   ?  ? Arm Ergometer  ? Level 2   ? Minutes 15   ? METs 2.2   ? ?  ?  ? ?  ? ? ?Social History  ? ?Tobacco Use  ?Smoking Status Former  ? Packs/day: 2.00  ? Years: 30.00  ? Pack years: 60.00  ? Types: Cigarettes  ? Quit date: 1999  ? Years since quitting: 24.2  ?Smokeless Tobacco Never  ? ? ?Goals Met:  ?Proper associated with RPD/PD & O2 Sat ?Exercise tolerated well ?No report of concerns or symptoms today ?Strength training completed today ? ?Goals Unmet:  ?Not Applicable ? ?Comments: Service time is from 1322 to 1340. ? ? ? ?Dr. Rodman Pickle is Medical Director for Pulmonary Rehab at [facility].  ?

## 2021-05-25 ENCOUNTER — Encounter (HOSPITAL_COMMUNITY)
Admission: RE | Admit: 2021-05-25 | Discharge: 2021-05-25 | Disposition: A | Discharge status: Home / Self Care | Payer: PPO | Source: Ambulatory Visit | Attending: Pulmonary Disease | Admitting: Pulmonary Disease

## 2021-05-25 ENCOUNTER — Other Ambulatory Visit: Payer: Self-pay

## 2021-05-25 DIAGNOSIS — J449 Chronic obstructive pulmonary disease, unspecified: Secondary | ICD-10-CM | POA: Diagnosis not present

## 2021-05-25 NOTE — Telephone Encounter (Signed)
Called and spoke with patient. He verbalized understanding. He wanted to know if he could get a POC to have when he goes to rehab and travels. The original order from 05/11/21 did not have POC on it so he is aware that I will place the order again.  ? ?He is aware to call us if he has any questions.  ? ?Order has been placed.  ? ?Nothing further needed at time of call.  ?

## 2021-05-25 NOTE — Telephone Encounter (Signed)
Patient checking on oxygen order. Patient phone number is [phone]. ?

## 2021-05-25 NOTE — Progress Notes (Signed)
Daily Session Note ? ?Patient Details  ?Name: Benjamin [name] ?MRN: [id number] ?Date of Birth: 1951-02-06 ?Referring Provider:   ?Flowsheet Row Pulmonary Rehab Walk Test from 05/10/2021 in [place]  ?Referring Provider Icard  ? ?  ? ? ?Encounter Date: 05/25/2021 ? ?Check In: ? Session Check In - 05/25/21 1418   ? ?  ? Check-In  ? Supervising physician immediately available to respond to emergencies [facility] immediately available   ? Physician(s) Dr. Pietro Cassis   ? Location [facility]   ? Staff Present Rosebud Poles, RN, Quentin Ore, MS, ACSM-CEP, Exercise Physiologist;Jetta Gilford Rile BS, ACSM EP-C, Exercise Physiologist;Carlette Wilber Oliphant, RN, BSN   ? Virtual Visit No   ? Medication changes reported     No   ? Fall or balance concerns reported    No   ? Tobacco Cessation No Change   ? Warm-up and Cool-down Performed as group-led instruction   ? Resistance Training Performed Yes   ? VAD Patient? No   ? PAD/SET Patient? No   ?  ? Pain Assessment  ? Currently in Pain? No/denies   ? Multiple Pain Sites No   ? ?  ?  ? ?  ? ? ?Capillary Blood Glucose: ?No results found for this or any previous visit (from the past 24 hour(s)). ? ? ? ?Social History  ? ?Tobacco Use  ?Smoking Status Former  ? Packs/day: 2.00  ? Years: 30.00  ? Pack years: 60.00  ? Types: Cigarettes  ? Quit date: 1999  ? Years since quitting: 24.2  ?Smokeless Tobacco Never  ? ? ?Goals Met:  ?Proper associated with RPD/PD & O2 Sat ?Exercise tolerated well ?No report of concerns or symptoms today ?Strength training completed today ? ?Goals Unmet:  ?Not Applicable ? ?Comments: Service time is from 1320 to 1440.  ? ? ?Dr. Rodman Pickle is Medical Director for Pulmonary Rehab at [facility].  ?

## 2021-05-26 ENCOUNTER — Telehealth: Payer: Self-pay | Admitting: Pulmonary Disease

## 2021-05-26 NOTE — Telephone Encounter (Signed)
Spoke with Estill Bamberg from [place] who states they need a new order for pt's O2 as well as a face-to-face visit before 06/10/21. If OV is done after this pt will need a walk again as well. Pt was called and left voicemail to try and schedule OV with Dr. Valeta Harms before 06/10/21. If pt calls back front desk please schedule with Dr. Valeta Harms before 06/10/21. Will leave encounter open.  ? ?Routing to Dr. Valeta Harms as Juluis Rainier ? ?

## 2021-05-30 ENCOUNTER — Encounter (HOSPITAL_COMMUNITY)
Admission: RE | Admit: 2021-05-30 | Discharge: 2021-05-30 | Disposition: A | Discharge status: Home / Self Care | Payer: PPO | Source: Ambulatory Visit | Attending: Pulmonary Disease | Admitting: Pulmonary Disease

## 2021-05-30 ENCOUNTER — Other Ambulatory Visit: Payer: Self-pay

## 2021-05-30 DIAGNOSIS — J449 Chronic obstructive pulmonary disease, unspecified: Secondary | ICD-10-CM

## 2021-05-30 NOTE — Progress Notes (Signed)
Daily Session Note ? ?Patient Details  ?Name: Benjamin [name] ?MRN: [id number] ?Date of Birth: 01-05-1951 ?Referring Provider:   ?Flowsheet Row Pulmonary Rehab Walk Test from 05/10/2021 in [place]  ?Referring Provider Icard  ? ?  ? ? ?Encounter Date: 05/30/2021 ? ?Check In: ? Session Check In - 05/30/21 1508   ? ?  ? Check-In  ? Supervising physician immediately available to respond to emergencies [facility] immediately available   ? Physician(s) Dr. Pietro Cassis   ? Location [facility]   ? Staff Present Rosebud Poles, RN, Quentin Ore, MS, ACSM-CEP, Exercise Physiologist;Lisa Ysidro Evert, RN   ? Virtual Visit No   ? Medication changes reported     No   ? Fall or balance concerns reported    No   ? Tobacco Cessation No Change   ? Warm-up and Cool-down Performed as group-led instruction   ? Resistance Training Performed Yes   ? VAD Patient? No   ? PAD/SET Patient? No   ?  ? Pain Assessment  ? Currently in Pain? No/denies   ? Multiple Pain Sites No   ? ?  ?  ? ?  ? ? ?Capillary Blood Glucose: ?No results found for this or any previous visit (from the past 24 hour(s)). ? ? ? ?Social History  ? ?Tobacco Use  ?Smoking Status Former  ? Packs/day: 2.00  ? Years: 30.00  ? Pack years: 60.00  ? Types: Cigarettes  ? Quit date: 1999  ? Years since quitting: 24.2  ?Smokeless Tobacco Never  ? ? ?Goals Met:  ?Proper associated with RPD/PD & O2 Sat ?Exercise tolerated well ?No report of concerns or symptoms today ?Strength training completed today ? ?Goals Unmet:  ?Not Applicable ? ?Comments: Service time is from 1320 to 1437.  ? ? ?Dr. Rodman Pickle is Medical Director for Pulmonary Rehab at [facility].  ?

## 2021-05-30 NOTE — Telephone Encounter (Signed)
Appt scheduled 06/01/2021 at 4:30. ?Will close encounter as nothing further needed.  ?

## 2021-05-31 NOTE — Progress Notes (Signed)
Pulmonary Individual Treatment Plan ? ?Patient Details  ?Name: Benjamin [name] ?MRN: [id number] ?Date of Birth: [birth year] ?Referring Provider:   ?Flowsheet Row Pulmonary Rehab Walk Test from 05/10/2021 in [place]  ?Referring Provider Icard  ? ?  ? ? ?Initial Encounter Date:  ?Flowsheet Row Pulmonary Rehab Walk Test from 05/10/2021 in [place]  ?Date 05/10/21  ? ?  ? ? ?Visit Diagnosis: Stage 3 severe COPD by GOLD classification ([place]) ? ?Patient's Home Medications on Admission:  ? ?Current Outpatient Medications:  ?  albuterol (PROVENTIL) (2.5 MG/3ML) 0.083% nebulizer solution, Take 3 mLs (2.5 mg total) by nebulization every 4 (four) hours as needed for wheezing or shortness of breath., Disp: 75 mL, Rfl: 12 ?  albuterol (VENTOLIN HFA) 108 (90 Base) MCG/ACT inhaler, INHALE 2 PUFFS INTO THE LUNGS EVERY 6 HOURS AS NEEDED FOR WHEEZING OR SHORTNESS OF BREATH, Disp: 8.5 g, Rfl: 5 ?  alfuzosin (UROXATRAL) 10 MG 24 hr tablet, Take 10 mg by mouth daily with breakfast., Disp: , Rfl:  ?  Ascorbic Acid (VITAMIN C) 1000 MG tablet, Take 1,000 mg by mouth daily., Disp: , Rfl:  ?  aspirin 81 MG tablet, Take 81 mg by mouth daily., Disp: , Rfl:  ?  CIALIS 5 MG tablet, Take 5 mg by mouth daily as needed for erectile dysfunction.  (Patient not taking: Reported on 05/10/2021), Disp: , Rfl:  ?  ibuprofen (ADVIL,MOTRIN) 600 MG tablet, Take 1 tablet (600 mg total) by mouth every 8 (eight) hours as needed., Disp: 15 tablet, Rfl: 0 ?  lisinopril (PRINIVIL,ZESTRIL) 20 MG tablet, Take 20 mg by mouth daily., Disp: , Rfl:  ?  Multiple Vitamin (MULTIVITAMIN) tablet, Take 1 tablet by mouth daily., Disp: , Rfl:  ? ?Past Medical History: ?Past Medical History:  ?Diagnosis Date  ? BPH (benign prostatic hyperplasia)   ? COPD (chronic obstructive pulmonary disease) ([place])   ? Diverticulosis   ? ED (erectile dysfunction)   ? External hemorrhoid   ? Hypertension   ? Internal hemorrhoid   ? Kidney  stone   ? Personal history of colonic adenomas 05/15/2012  ? ? ?Tobacco Use: ?Social History  ? ?Tobacco Use  ?Smoking Status Former  ? Packs/day: 2.00  ? Years: 30.00  ? Pack years: 60.00  ? Types: Cigarettes  ? Quit date: 1999  ? Years since quitting: 24.2  ?Smokeless Tobacco Never  ? ? ?Labs: ?Review Flowsheet   ? ?    ? View : No data to display.  ?  ?  ?  ?  ?  ? ? ?Capillary Blood Glucose: ?No results found for: GLUCAP ? ? ?Pulmonary Assessment Scores: ? Pulmonary Assessment Scores   ? ? [place] Name 05/10/21 1120  ?  ?  ?  ? ADL UCSD  ? ADL Phase Entry    ? SOB Score total 7    ?  ? CAT Score  ? CAT Score 10    ? ?  ?  ? ?  ? ?UCSD: ?Self-administered rating of dyspnea associated with activities of daily living (ADLs) ?6-point scale (0 = "not at all" to 5 = "maximal or unable to do because of breathlessness")  ?Scoring Scores range from 0 to 120.  Minimally important difference is 5 units ? ?CAT: ?CAT can identify the health impairment of COPD patients and is better correlated with disease progression.  ?CAT has a scoring range of zero to 40. The CAT score is  classified into four groups of low (less than 10), medium (10 - 20), high (21-30) and very high (31-40) based on the impact level of disease on health status. A CAT score over 10 suggests significant symptoms.  A worsening CAT score could be explained by an exacerbation, poor medication adherence, poor inhaler technique, or progression of COPD or comorbid conditions.  ?CAT MCID is 2 points ? ?mMRC: ?mMRC (Modified Medical Research Council) Dyspnea Scale is used to assess the degree of baseline functional disability in patients of respiratory disease due to dyspnea. ?No minimal important difference is established. A decrease in score of 1 point or greater is considered a positive change.  ? ?Pulmonary Function Assessment: ? Pulmonary Function Assessment - 05/10/21 1052   ? ?  ? Breath  ? Bilateral Breath Sounds Clear   ? Shortness of Breath Limiting  activity;Yes   ? ?  ?  ? ?  ? ? ?Exercise Target Goals: ?Exercise Program Goal: ?Individual exercise prescription set using results from initial 6 min walk test and THRR while considering  patient?s activity barriers and safety.  ? ?Exercise Prescription Goal: ?Initial exercise prescription builds to 30-45 minutes a day of aerobic activity, 2-3 days per week.  Home exercise guidelines will be given to patient during program as part of exercise prescription that the participant will acknowledge. ? ?Activity Barriers & Risk Stratification: ? Activity Barriers & Cardiac Risk Stratification - 05/10/21 1051   ? ?  ? Activity Barriers & Cardiac Risk Stratification  ? Activity Barriers Deconditioning;Muscular Weakness;Shortness of Breath   ? ?  ?  ? ?  ? ? ?6 Minute Walk: ? 6 Minute Walk   ? ? [place] Name 05/10/21 1139  ?  ?  ?  ? 6 Minute Walk  ? Phase Initial    ? Distance 1000 feet    ? Walk Time 6 minutes    ? # of Rest Breaks 1  2:30-3:10 to put on O2    ? MPH 1.89    ? METS 2.45    ? RPE 9    ? Perceived Dyspnea  1    ? VO2 Peak 8.56    ? Symptoms No    ? Resting HR 60 bpm    ? Resting BP 122/72    ? Resting Oxygen Saturation  92 %    ? Exercise Oxygen Saturation  during 6 min walk 84 %    ? Max Ex. HR 97 bpm    ? Max Ex. BP 142/78    ? 2 Minute Post BP 126/78    ?  ? Interval HR  ? 1 Minute HR 88    ? 2 Minute HR 97    ? 3 Minute HR 83    ? 4 Minute HR 87    ? 5 Minute HR 93    ? 6 Minute HR 76    ? 2 Minute Post HR 63    ? Interval Heart Rate? Yes    ?  ? Interval Oxygen  ? Interval Oxygen? Yes    ? Baseline Oxygen Saturation % 92 %    ? 1 Minute Oxygen Saturation % 87 %    ? 1 Minute Liters of Oxygen 0 L    ? 2 Minute Oxygen Saturation % 86 %  2:30: 84%    ? 2 Minute Liters of Oxygen 0 L  Increased to 1L after 2:30    ? 3 Minute Oxygen Saturation % 88 %    ?  3 Minute Liters of Oxygen 1 L    ? 4 Minute Oxygen Saturation % 90 %    ? 4 Minute Liters of Oxygen 1 L    ? 5 Minute Oxygen Saturation % 88 %    ? 5 Minute  Liters of Oxygen 1 L    ? 6 Minute Oxygen Saturation % 88 %    ? 6 Minute Liters of Oxygen 1 L    ? 2 Minute Post Oxygen Saturation % 94 %    ? 2 Minute Post Liters of Oxygen 1 L    ? ?  ?  ? ?  ? ? ?Oxygen Initial Assessment: ? Oxygen Initial Assessment - 05/10/21 1052   ? ?  ? Home Oxygen  ? Home Oxygen Device None   ? Home Exercise Oxygen Prescription None   ? Home Resting Oxygen Prescription None   ? Compliance with Home Oxygen Use Yes   ?  ? Initial 6 min Walk  ? Oxygen Used Continuous   ? Liters per minute 1   ?  ? Program Oxygen Prescription  ? Program Oxygen Prescription Continuous   ? Liters per minute 1   ?  ? Intervention  ? Short Term Goals To learn and exhibit compliance with exercise, home and travel O2 prescription;To learn and understand importance of monitoring SPO2 with pulse oximeter and demonstrate accurate use of the pulse oximeter.;To learn and understand importance of maintaining oxygen saturations>88%;To learn and demonstrate proper pursed lip breathing techniques or other breathing techniques. ;To learn and demonstrate proper use of respiratory medications   ? Long  Term Goals Exhibits compliance with exercise, home  and travel O2 prescription;Verbalizes importance of monitoring SPO2 with pulse oximeter and return demonstration;Maintenance of O2 saturations>88%;Exhibits proper breathing techniques, such as pursed lip breathing or other method taught during program session;Compliance with respiratory medication;Demonstrates proper use of MDI?s   ? ?  ?  ? ?  ? ? ?Oxygen Re-Evaluation: ? Oxygen Re-Evaluation   ? ? [place] Name 05/23/21 0754  ?  ?  ?  ?  ?  ? Program Oxygen Prescription  ? Program Oxygen Prescription Continuous      ? Liters per minute 1      ? Comments I discussed with his pulmonologist the need for oxgyen. Dr. Valeta Harms has prescribed home O2 to [place], but [place] is unsure if he wants to accept home O2.      ?  ? Home Oxygen  ? Home Oxygen Device None      ? Home Exercise Oxygen  Prescription None      ? Home Resting Oxygen Prescription None      ? Compliance with Home Oxygen Use Yes      ?  ? Goals/Expected Outcomes  ? Short Term Goals To learn and exhibit compliance with exercise, home and

## 2021-06-01 ENCOUNTER — Encounter (HOSPITAL_COMMUNITY)
Admission: RE | Admit: 2021-06-01 | Discharge: 2021-06-01 | Disposition: A | Discharge status: Home / Self Care | Payer: PPO | Source: Ambulatory Visit | Attending: Pulmonary Disease | Admitting: Pulmonary Disease

## 2021-06-01 ENCOUNTER — Encounter: Payer: Self-pay | Admitting: Pulmonary Disease

## 2021-06-01 ENCOUNTER — Ambulatory Visit: Payer: PPO | Admitting: Pulmonary Disease

## 2021-06-01 VITALS — BP 120/64 | HR 84 | Temp 97.6°F | Ht 70.0 in | Wt 210.0 lb

## 2021-06-01 DIAGNOSIS — J449 Chronic obstructive pulmonary disease, unspecified: Secondary | ICD-10-CM

## 2021-06-01 DIAGNOSIS — J9611 Chronic respiratory failure with hypoxia: Secondary | ICD-10-CM | POA: Diagnosis not present

## 2021-06-01 DIAGNOSIS — Z87891 Personal history of nicotine dependence: Secondary | ICD-10-CM | POA: Diagnosis not present

## 2021-06-01 NOTE — Progress Notes (Signed)
Daily Session Note ? ?Patient Details  ?Name: Benjamin [name] ?MRN: [id number] ?Date of Birth: 1950/05/18 ?Referring Provider:   ?Flowsheet Row Pulmonary Rehab Walk Test from 05/10/2021 in [place]  ?Referring Provider Icard  ? ?  ? ? ?Encounter Date: 06/01/2021 ? ?Check In: ? Session Check In - 06/01/21 1423   ? ?  ? Check-In  ? Supervising physician immediately available to respond to emergencies [facility] immediately available   ? Physician(s) Dr. [place] Pink   ? Location [facility]   ? Staff Present Elmon Else, MS, ACSM-CEP, Exercise Physiologist;Lisa Ysidro Evert, RN;Other   ? Virtual Visit No   ? Medication changes reported     No   ? Fall or balance concerns reported    No   ? Tobacco Cessation No Change   ? Warm-up and Cool-down Performed as group-led instruction   ? Resistance Training Performed Yes   ? VAD Patient? No   ? PAD/SET Patient? No   ?  ? Pain Assessment  ? Currently in Pain? No/denies   ? Multiple Pain Sites No   ? ?  ?  ? ?  ? ? ?Capillary Blood Glucose: ?No results found for this or any previous visit (from the past 24 hour(s)). ? ? ? ?Social History  ? ?Tobacco Use  ?Smoking Status Former  ? Packs/day: 2.00  ? Years: 30.00  ? Pack years: 60.00  ? Types: Cigarettes  ? Quit date: 1999  ? Years since quitting: 24.2  ?Smokeless Tobacco Never  ? ? ?Goals Met:  ?Proper associated with RPD/PD & O2 Sat ?Independence with exercise equipment ?Exercise tolerated well ?No report of concerns or symptoms today ?Strength training completed today ? ?Goals Unmet:  ?Not Applicable ? ?Comments: Service time is from 1320 to 1438.  ? ? ?Dr. Rodman Pickle is Medical Director for Pulmonary Rehab at [facility].  ?

## 2021-06-01 NOTE — Patient Instructions (Addendum)
Thank you for visiting Dr. Valeta Harms at [facility]. ?Today we recommend the following: ? ?O2 sat goal >88%  ?Stay on trelegy  ?Albuterol as needed  ?Walk you today for o2 needs  ? ?Return in about 3 months (around 09/01/2021) for with APP or Dr. Valeta Harms. ? ? ? ?Please do your part to reduce the spread of COVID-19.  ? ?

## 2021-06-01 NOTE — Progress Notes (Signed)
? ?Synopsis: Referred in May 2021 for COPD by Rankins, Bill Salinas, MD ? ?Subjective:  ? ?PATIENT ID: Benjamin [name] GENDER: male DOB: 07/06/1950, MRN: [id number] ? ?Chief Complaint  ?Patient presents with  ? Follow-up  ?  Pt states he has been doing okay since last visit and denies any complaints.  ? ? ?This is a 71 year old gentleman former tobacco abuse history quit smoking when he was 89 smoked from the age 89 for 30 years greater than a pack a day.  Diagnosis COPD in the past.  Has been managed for the past 10 years on Symbicort and as needed albuterol.  He tries to stay active however is limited due to his dyspnea at this point.  Additional medical history includes hypertension BPH.  Lives with his wife.  Recently had first grandchild.  Daughter is a travel Marine scientist in [place].  He recently traveled there to visit.  Also traveled to [facility] to visit son and new grandchild.  Both him and his wife have received their Covid vaccine.  Overall he has had progressive shortness of breath for the past several weeks.  He the pollen outside feels like it is more difficult for him to breathe.  He does have occasional wheezing.  He does not have a nebulizer at home. ? ?OV 04/04/2020: Here today for COPD follow-up.  Patient had pulmonary function test completed in August with an FEV1 of 39% predicted, elevated TLC and RV ratio concerning for air trapping.  We reviewed patient's pulmonary function test today in the office.  He was switched at that time to Trelegy inhaler.  Has been doing okay with that.  He does feel like his albuterol nebulizer makes his heart race at times.  Otherwise has not had any hospitalizations or any exacerbations this past year after being placed on Trelegy.  He was able to go visit his daughter in [place].  Who works as a travel Marine scientist. ? ?OV 06/01/2021: Here today for follow-up.  Managed for his COPD currently using Trelegy daily.  He was involved in pulmonary rehab had desaturations.  He  needs face-to-face evaluation today for home health to start oxygen therapy.  He also has not had an ONO. ? ? ?Past Medical History:  ?Diagnosis Date  ? BPH (benign prostatic hyperplasia)   ? COPD (chronic obstructive pulmonary disease) ([place])   ? Diverticulosis   ? ED (erectile dysfunction)   ? External hemorrhoid   ? Hypertension   ? Internal hemorrhoid   ? Kidney stone   ? Personal history of colonic adenomas 05/15/2012  ?  ? ?Family History  ?Problem Relation Age of Onset  ? Ovarian cancer Mother   ? Thyroid disease Mother   ? Leukemia Mother   ? Bone cancer Mother   ? Prostate cancer Father   ? Hypertension Father   ? Colon cancer Neg Hx   ?  ? ?Past Surgical History:  ?Procedure Laterality Date  ? COLONOSCOPY W/ POLYPECTOMY    ? dental implants    ? upper  ? HEMORRHOID SURGERY    ? LEG SURGERY Right   ? fx- right repair plate screw  ? ? ?Social History  ? ?Socioeconomic History  ? Marital status: Married  ?  Spouse name: Not on file  ? Number of children: Not on file  ? Years of education: Not on file  ? Highest education level: Not on file  ?Occupational History  ? Not on file  ?Tobacco Use  ?  Smoking status: Former  ?  Packs/day: 2.00  ?  Years: 30.00  ?  Pack years: 60.00  ?  Types: Cigarettes  ?  Quit date: 1999  ?  Years since quitting: 24.2  ? Smokeless tobacco: Never  ?Substance and Sexual Activity  ? Alcohol use: No  ?  Alcohol/week: 0.0 standard drinks  ? Drug use: Yes  ?  Types: Marijuana  ? Sexual activity: Not on file  ?Other Topics Concern  ? Not on file  ?Social History Narrative  ? Not on file  ? ?Social Determinants of Health  ? ?Financial Resource Strain: Not on file  ?Food Insecurity: Not on file  ?Transportation Needs: Not on file  ?Physical Activity: Not on file  ?Stress: Not on file  ?Social Connections: Not on file  ?Intimate Partner Violence: Not on file  ?  ? ?Allergies  ?Allergen Reactions  ? Keflex [Cephalexin] Rash  ? Penicillins Rash  ?  ? ?Outpatient Medications Prior to Visit   ?Medication Sig Dispense Refill  ? albuterol (PROVENTIL) (2.5 MG/3ML) 0.083% nebulizer solution Take 3 mLs (2.5 mg total) by nebulization every 4 (four) hours as needed for wheezing or shortness of breath. 75 mL 12  ? albuterol (VENTOLIN HFA) 108 (90 Base) MCG/ACT inhaler INHALE 2 PUFFS INTO THE LUNGS EVERY 6 HOURS AS NEEDED FOR WHEEZING OR SHORTNESS OF BREATH 8.5 g 5  ? alfuzosin (UROXATRAL) 10 MG 24 hr tablet Take 10 mg by mouth daily with breakfast.    ? Ascorbic Acid (VITAMIN C) 1000 MG tablet Take 1,000 mg by mouth daily.    ? aspirin 81 MG tablet Take 81 mg by mouth daily.    ? CIALIS 5 MG tablet Take 5 mg by mouth daily as needed for erectile dysfunction.    ? ibuprofen (ADVIL,MOTRIN) 600 MG tablet Take 1 tablet (600 mg total) by mouth every 8 (eight) hours as needed. 15 tablet 0  ? lisinopril (PRINIVIL,ZESTRIL) 20 MG tablet Take 20 mg by mouth daily.    ? Multiple Vitamin (MULTIVITAMIN) tablet Take 1 tablet by mouth daily.    ? ?No facility-administered medications prior to visit.  ? ? ?Review of Systems  ?Constitutional:  Negative for chills, fever, malaise/fatigue and weight loss.  ?HENT:  Negative for hearing loss, sore throat and tinnitus.   ?Eyes:  Negative for blurred vision and double vision.  ?Respiratory:  Positive for shortness of breath. Negative for cough, hemoptysis, sputum production, wheezing and stridor.   ?Cardiovascular:  Negative for chest pain, palpitations, orthopnea, leg swelling and PND.  ?Gastrointestinal:  Negative for abdominal pain, constipation, diarrhea, heartburn, nausea and vomiting.  ?Genitourinary:  Negative for dysuria, hematuria and urgency.  ?Musculoskeletal:  Negative for joint pain and myalgias.  ?Skin:  Negative for itching and rash.  ?Neurological:  Negative for dizziness, tingling, weakness and headaches.  ?Endo/Heme/Allergies:  Negative for environmental allergies. Does not bruise/bleed easily.  ?Psychiatric/Behavioral:  Negative for depression. The patient is not  nervous/anxious and does not have insomnia.   ?All other systems reviewed and are negative. ? ? ?Objective:  ?Physical Exam ?Vitals reviewed.  ?Constitutional:   ?   General: He is not in acute distress. ?   Appearance: He is well-developed.  ?HENT:  ?   Head: Normocephalic and atraumatic.  ?Eyes:  ?   General: No scleral icterus. ?   Conjunctiva/sclera: Conjunctivae normal.  ?   Pupils: Pupils are equal, round, and reactive to light.  ?Neck:  ?   Vascular: No  JVD.  ?   Trachea: No tracheal deviation.  ?Cardiovascular:  ?   Rate and Rhythm: Normal rate and regular rhythm.  ?   Heart sounds: Normal heart sounds. No murmur heard. ?Pulmonary:  ?   Effort: Pulmonary effort is normal. No tachypnea, accessory muscle usage or respiratory distress.  ?   Breath sounds: No stridor. No wheezing, rhonchi or rales.  ?   Comments: Diminished breath sounds bilaterally ?Abdominal:  ?   General: There is no distension.  ?   Palpations: Abdomen is soft.  ?   Tenderness: There is no abdominal tenderness.  ?Musculoskeletal:     ?   General: No tenderness.  ?   Cervical back: Neck supple.  ?Lymphadenopathy:  ?   Cervical: No cervical adenopathy.  ?Skin: ?   General: Skin is warm and dry.  ?   Capillary Refill: Capillary refill takes less than 2 seconds.  ?   Findings: No rash.  ?Neurological:  ?   Mental Status: He is alert and oriented to person, place, and time.  ?Psychiatric:     ?   Behavior: Behavior normal.  ? ? ? ?Vitals:  ? 06/01/21 1628  ?BP: 120/64  ?Pulse: 84  ?Temp: 97.6 ?F (36.4 ?C)  ?TempSrc: Oral  ?SpO2: 94%  ?Weight: 210 lb (95.3 kg)  ?Height: '5\' 10"'$  (1.778 m)  ? ?94% on RA ?BMI Readings from Last 3 Encounters:  ?06/01/21 30.13 kg/m?  ?05/23/21 29.53 kg/m?  ?05/10/21 29.81 kg/m?  ? ?Wt Readings from Last 3 Encounters:  ?06/01/21 210 lb (95.3 kg)  ?05/23/21 208 lb 12.4 oz (94.7 kg)  ?05/10/21 210 lb 12.2 oz (95.6 kg)  ? ? ? ?CBC ?   ?Component Value Date/Time  ? WBC 9.0 10/07/2019 1640  ? RBC 4.54 10/07/2019 1640  ? HGB  14.6 10/07/2019 1640  ? HCT 43.6 10/07/2019 1640  ? PLT 187.0 10/07/2019 1640  ? MCV 96.0 10/07/2019 1640  ? MCH 30.6 12/24/2013 0951  ? MCHC 33.4 10/07/2019 1640  ? RDW 13.3 10/07/2019 1640  ? LYMPHSABS 2.2 08

## 2021-06-05 DIAGNOSIS — R0602 Shortness of breath: Secondary | ICD-10-CM | POA: Diagnosis not present

## 2021-06-06 ENCOUNTER — Encounter (HOSPITAL_COMMUNITY)
Admission: RE | Admit: 2021-06-06 | Discharge: 2021-06-06 | Disposition: A | Discharge status: Home / Self Care | Payer: PPO | Source: Ambulatory Visit | Attending: Pulmonary Disease | Admitting: Pulmonary Disease

## 2021-06-06 VITALS — Wt 208.6 lb

## 2021-06-06 DIAGNOSIS — J449 Chronic obstructive pulmonary disease, unspecified: Secondary | ICD-10-CM | POA: Insufficient documentation

## 2021-06-06 NOTE — Progress Notes (Signed)
Daily Session Note ? ?Patient Details  ?Name: Benjamin [name] ?MRN: [id number] ?Date of Birth: [birth year] ?Referring Provider:   ?Flowsheet Row Pulmonary Rehab Walk Test from 05/10/2021 in [facility]  ?Referring Provider Icard  ? ?  ? ? ?Encounter Date: 06/06/2021 ? ?Check In: ? Session Check In - 06/06/21 1504   ? ?  ? Check-In  ? Supervising physician immediately available to respond to emergencies [facility] immediately available   ? Physician(s) Dr. Maryland Pink   ? Location [facility]   ? Staff Present Elmon Else, MS, ACSM-CEP, Exercise Physiologist;Luda Charbonneau Ysidro Evert, RN;David Lilyan Punt, MS, ACSM-CEP, CCRP, Exercise Physiologist   ? Virtual Visit No   ? Medication changes reported     No   ? Fall or balance concerns reported    No   ? Tobacco Cessation No Change   ? Warm-up and Cool-down Performed as group-led instruction   ? Resistance Training Performed Yes   ? VAD Patient? No   ? PAD/SET Patient? No   ?  ? Pain Assessment  ? Currently in Pain? No/denies   ? Multiple Pain Sites No   ? ?  ?  ? ?  ? ? ?Capillary Blood Glucose: ?No results found for this or any previous visit (from the past 24 hour(s)). ? ? Exercise Prescription Changes - 06/06/21 1500   ? ?  ? Response to Exercise  ? Blood Pressure (Admit) 120/74   ? Blood Pressure (Exercise) 124/70   ? Blood Pressure (Exit) 98/70   ? Heart Rate (Admit) 84 bpm   ? Heart Rate (Exercise) 102 bpm   ? Heart Rate (Exit) 83 bpm   ? Oxygen Saturation (Admit) 95 %   ? Oxygen Saturation (Exercise) 92 %   ? Oxygen Saturation (Exit) 95 %   ? Rating of Perceived Exertion (Exercise) 11   ? Perceived Dyspnea (Exercise) 1   ? Duration Continue with 30 min of aerobic exercise without signs/symptoms of physical distress.   ? Intensity THRR unchanged   ?  ? Resistance Training  ? Training Prescription Yes   ? Weight Blue bands   ? Reps 10-15   ? Time 10 Minutes   ?  ? Oxygen  ? Oxygen Continuous   ? Liters 2   Pt wanted to use his  personal POC today to exercise  ?  ? NuStep  ? Level 3   ? SPM 80   ? Minutes 15   ? METs 2   ?  ? Arm Ergometer  ? Level 3   ? Minutes 15   ? METs 2   ? ?  ?  ? ?  ? ? ?Social History  ? ?Tobacco Use  ?Smoking Status Former  ? Packs/day: 2.00  ? Years: 30.00  ? Pack years: 60.00  ? Types: Cigarettes  ? Quit date: 1999  ? Years since quitting: 24.2  ?Smokeless Tobacco Never  ? ? ?Goals Met:  ?Exercise tolerated well ?No report of concerns or symptoms today ?Strength training completed today ? ?Goals Unmet:  ?Not Applicable ? ?Comments: Service time is from 1320 to 1445 ? ? ? ?Dr. Rodman Pickle is Medical Director for Pulmonary Rehab at [facility].  ?

## 2021-06-08 ENCOUNTER — Ambulatory Visit: Payer: PPO | Admitting: Pulmonary Disease

## 2021-06-08 ENCOUNTER — Encounter (HOSPITAL_COMMUNITY)
Admission: RE | Admit: 2021-06-08 | Discharge: 2021-06-08 | Disposition: A | Discharge status: Home / Self Care | Payer: PPO | Source: Ambulatory Visit | Attending: Pulmonary Disease | Admitting: Pulmonary Disease

## 2021-06-08 DIAGNOSIS — J449 Chronic obstructive pulmonary disease, unspecified: Secondary | ICD-10-CM | POA: Diagnosis not present

## 2021-06-08 NOTE — Progress Notes (Signed)
Daily Session Note ? ?Patient Details  ?Name: Benjamin [name] ?MRN: [id number] ?Date of Birth: 1950-11-01 ?Referring Provider:   ?Flowsheet Row Pulmonary Rehab Walk Test from 05/10/2021 in [facility]  ?Referring Provider Icard  ? ?  ? ? ?Encounter Date: 06/08/2021 ? ?Check In: ? Session Check In - 06/08/21 1358   ? ?  ? Check-In  ? Supervising physician immediately available to respond to emergencies [facility] immediately available   ? Physician(s) Dr.Dahal   ? Location [facility]   ? Staff Present Elmon Else, MS, ACSM-CEP, Exercise Physiologist;Lisa Ysidro Evert, RN;Joan Leonia Reeves, RN, BSN   ? Virtual Visit No   ? Medication changes reported     No   ? Fall or balance concerns reported    No   ? Tobacco Cessation No Change   ? Warm-up and Cool-down Performed as group-led instruction   ? Resistance Training Performed Yes   ? VAD Patient? No   ? PAD/SET Patient? No   ?  ? Pain Assessment  ? Currently in Pain? No/denies   ? Multiple Pain Sites No   ? ?  ?  ? ?  ? ? ?Capillary Blood Glucose: ?No results found for this or any previous visit (from the past 24 hour(s)). ? ? ? ?Social History  ? ?Tobacco Use  ?Smoking Status Former  ? Packs/day: 2.00  ? Years: 30.00  ? Pack years: 60.00  ? Types: Cigarettes  ? Quit date: 1999  ? Years since quitting: 24.2  ?Smokeless Tobacco Never  ? ? ?Goals Met:  ?Proper associated with RPD/PD & O2 Sat ?Exercise tolerated well ?No report of concerns or symptoms today ?Strength training completed today ? ?Goals Unmet:  ?Not Applicable ? ?Comments: Service time is from 1315 to 1435.  ? ? ?Dr. Rodman Pickle is Medical Director for Pulmonary Rehab at [facility].  ?

## 2021-06-13 ENCOUNTER — Encounter (HOSPITAL_COMMUNITY): Payer: PPO

## 2021-06-13 ENCOUNTER — Telehealth (HOSPITAL_COMMUNITY): Payer: Self-pay | Admitting: Family Medicine

## 2021-06-15 ENCOUNTER — Encounter (HOSPITAL_COMMUNITY)
Admission: RE | Admit: 2021-06-15 | Discharge: 2021-06-15 | Disposition: A | Discharge status: Home / Self Care | Payer: PPO | Source: Ambulatory Visit | Attending: Pulmonary Disease | Admitting: Pulmonary Disease

## 2021-06-15 VITALS — Wt 208.6 lb

## 2021-06-15 DIAGNOSIS — J449 Chronic obstructive pulmonary disease, unspecified: Secondary | ICD-10-CM | POA: Diagnosis not present

## 2021-06-15 NOTE — Progress Notes (Signed)
Daily Session Note ? ?Patient Details  ?Name: Lonnie Reth Tanori ?MRN: [id number] ?Date of Birth: 05/17/1950 ?Referring Provider:   ?Flowsheet Row Pulmonary Rehab Walk Test from 05/10/2021 in [place]  ?Referring Provider Icard  ? ?  ? ? ?Encounter Date: 06/15/2021 ? ?Check In: ? Session Check In - 06/15/21 1429   ? ?  ? Check-In  ? Supervising physician immediately available to respond to emergencies [facility] immediately available   ? Physician(s) Dr. Maryland Pink   ? Location [facility]   ? Staff Present Lesly Rubenstein, MS, ACSM-CEP, CCRP, Exercise Physiologist;Lazaria Schaben Leonia Reeves, RN, BSN;Carlette Wilber Oliphant, RN, Quentin Ore, MS, ACSM-CEP, Exercise Physiologist   ? Virtual Visit No   ? Medication changes reported     No   ? Fall or balance concerns reported    No   ? Tobacco Cessation No Change   ? Warm-up and Cool-down Performed as group-led instruction   ? Resistance Training Performed Yes   ? VAD Patient? No   ? PAD/SET Patient? No   ?  ? Pain Assessment  ? Currently in Pain? No/denies   ? Multiple Pain Sites No   ? ?  ?  ? ?  ? ? ?Capillary Blood Glucose: ?No results found for this or any previous visit (from the past 24 hour(s)). ? ? ? ?Social History  ? ?Tobacco Use  ?Smoking Status Former  ? Packs/day: 2.00  ? Years: 30.00  ? Pack years: 60.00  ? Types: Cigarettes  ? Quit date: 1999  ? Years since quitting: 24.2  ?Smokeless Tobacco Never  ? ? ?Goals Met:  ?Proper associated with RPD/PD & O2 Sat ?Exercise tolerated well ?No report of concerns or symptoms today ?Strength training completed today ? ?Goals Unmet:  ?Not Applicable ? ?Comments: Service time is from 1320 to 1440 ? ? ? ?Dr. Rodman Pickle is Medical Director for Pulmonary Rehab at [facility].  ?

## 2021-06-20 ENCOUNTER — Encounter (HOSPITAL_COMMUNITY)
Admission: RE | Admit: 2021-06-20 | Discharge: 2021-06-20 | Disposition: A | Discharge status: Home / Self Care | Payer: PPO | Source: Ambulatory Visit | Attending: Pulmonary Disease | Admitting: Pulmonary Disease

## 2021-06-20 VITALS — Wt 207.5 lb

## 2021-06-20 DIAGNOSIS — J449 Chronic obstructive pulmonary disease, unspecified: Secondary | ICD-10-CM | POA: Diagnosis not present

## 2021-06-20 NOTE — Progress Notes (Signed)
Daily Session Note ? ?Patient Details  ?Name: Tayon W Rotert ?MRN: [id number] ?Date of Birth: 07/11/1950 ?Referring Provider:   ?Flowsheet Row Pulmonary Rehab Walk Test from 05/10/2021 in [facility]  ?Referring Provider Icard  ? ?  ? ? ?Encounter Date: 06/20/2021 ? ?Check In: ? Session Check In - 06/20/21 1513   ? ?  ? Check-In  ? Supervising physician immediately available to respond to emergencies [facility] immediately available   ? Physician(s) Dr. Dahal   ? Location [facility]   ? Staff Present Joan Behrens, RN, BSN;Kaylee Davis, MS, ACSM-CEP, Exercise Physiologist;Lisa Hughes, RN;Portia Payne, RN, BSN   ? Virtual Visit No   ? Medication changes reported     No   ? Fall or balance concerns reported    No   ? Tobacco Cessation No Change   ? Warm-up and Cool-down Performed as group-led instruction   ? Resistance Training Performed Yes   ? VAD Patient? No   ? PAD/SET Patient? No   ?  ? Pain Assessment  ? Currently in Pain? No/denies   ? Multiple Pain Sites No   ? ?  ?  ? ?  ? ? ?Capillary Blood Glucose: ?No results found for this or any previous visit (from the past 24 hour(s)). ? ? Exercise Prescription Changes - 06/20/21 1500   ? ?  ? Response to Exercise  ? Blood Pressure (Admit) 124/74   ? Blood Pressure (Exercise) 112/84   ? Blood Pressure (Exit) 142/70   ? Heart Rate (Admit) 70 bpm   ? Heart Rate (Exercise) 95 bpm   ? Heart Rate (Exit) 76 bpm   ? Oxygen Saturation (Admit) 94 %   ? Oxygen Saturation (Exercise) 93 %   ? Oxygen Saturation (Exit) 95 %   ? Rating of Perceived Exertion (Exercise) 11   ? Perceived Dyspnea (Exercise) 1   ? Duration Continue with 30 min of aerobic exercise without signs/symptoms of physical distress.   ? Intensity THRR unchanged   ?  ? Resistance Training  ? Training Prescription Yes   ? Weight Blue bands   ? Reps 10-15   ? Time 10 Minutes   ?  ? Oxygen  ? Oxygen Continuous   ? Liters 2   ?  ? NuStep  ? Level 3   ? SPM 80   ?  Minutes 15   ?  ? Arm Ergometer  ? Level 3   ? Minutes 15   ? ?  ?  ? ?  ? ? ?Social History  ? ?Tobacco Use  ?Smoking Status Former  ? Packs/day: 2.00  ? Years: 30.00  ? Pack years: 60.00  ? Types: Cigarettes  ? Quit date: 1999  ? Years since quitting: 24.3  ?Smokeless Tobacco Never  ? ? ?Goals Met:  ?Exercise tolerated well ?No report of concerns or symptoms today ?Strength training completed today ? ?Goals Unmet:  ?Not Applicable ? ?Comments: Service time is from 1316 to 1435 ? ? ? ?Dr. Jane Ellison is Medical Director for Pulmonary Rehab at [facility].  ?

## 2021-06-22 ENCOUNTER — Encounter (HOSPITAL_COMMUNITY)
Admission: RE | Admit: 2021-06-22 | Discharge: 2021-06-22 | Disposition: A | Discharge status: Home / Self Care | Payer: PPO | Source: Ambulatory Visit | Attending: Pulmonary Disease | Admitting: Pulmonary Disease

## 2021-06-22 DIAGNOSIS — J449 Chronic obstructive pulmonary disease, unspecified: Secondary | ICD-10-CM | POA: Diagnosis not present

## 2021-06-22 NOTE — Progress Notes (Signed)
Daily Session Note ? ?Patient Details  ?Name: Benjamin [name] ?MRN: [id number] ?Date of Birth: 04/17/1950 ?Referring Provider:   ?Flowsheet Row Pulmonary Rehab Walk Test from 05/10/2021 in [facility]  ?Referring Provider Icard  ? ?  ? ? ?Encounter Date: 06/22/2021 ? ?Check In: ? Session Check In - 06/22/21 1425   ? ?  ? Check-In  ? Supervising physician immediately available to respond to emergencies [facility] immediately available   ? Physician(s) Dr. Dahal   ? [facility]   ? Staff Present Joan Behrens, RN, BSN;Kaylee Davis, MS, ACSM-CEP, Exercise Physiologist;Lisa Hughes, RN   ? Virtual Visit No   ? Medication changes reported     No   ? Fall or balance concerns reported    No   ? Tobacco Cessation No Change   ? Warm-up and Cool-down Performed as group-led instruction   ? Resistance Training Performed Yes   ? VAD Patient? No   ? PAD/SET Patient? No   ?  ? Pain Assessment  ? Currently in Pain? No/denies   ? Multiple Pain Sites No   ? ?  ?  ? ?  ? ? ?Capillary Blood Glucose: ?No results found for this or any previous visit (from the past 24 hour(s)). ? ? ? ?Social History  ? ?Tobacco Use  ?Smoking Status Former  ? Packs/day: 2.00  ? Years: 30.00  ? Pack years: 60.00  ? Types: Cigarettes  ? Quit date: 1999  ? Years since quitting: 24.3  ?Smokeless Tobacco Never  ? ? ?Goals Met:  ?Exercise tolerated well ?No report of concerns or symptoms today ?Strength training completed today ? ?Goals Unmet:  ?Not Applicable ? ?Comments: Service time is from 1320 to 1435 ? ? ? ?Dr. Jane Ellison is Medical Director for Pulmonary Rehab at [facility].  ?

## 2021-06-22 NOTE — Progress Notes (Signed)
Home Exercise Prescription ?I have reviewed a Home Exercise Prescription with Vincient Vanaman Mclaine. Nijee is not currently exercising at home. He is planning to join [employer] and use their aerobic exercise equipment. He wants to attend that facility 2 non-rehab days/wk for 30 min/day. I stated that this was a good plan to have. I encouraged him to increase his frequency to 3 days/wk once he graduates. He agreed with my recommendations. Farhan seems motivated to exercise at home. The patient stated that their goals were to get back to playing tennis and pickle ball. We reviewed exercise guidelines, target heart rate during exercise, RPE Scale, weather conditions, endpoints for exercise, warmup and cool down. The patient is encouraged to come to me with any questions. I will continue to follow up with the patient to assist them with progression and safety.   ? ?Sheppard Plumber, MS, ACSM-CEP ?06/22/2021 ?3:27 PM  ?

## 2021-06-26 ENCOUNTER — Telehealth (HOSPITAL_COMMUNITY): Payer: Self-pay

## 2021-06-26 NOTE — Telephone Encounter (Signed)
Called to discuss early graduation from Pulmonary Rehab. Benjamin [name] mentioned to me that he was going on vacation and would be out for a couple of weeks . I told him it would be best to graduate him early to get post numbers. Jerrid agreed with the early graduation.  ?

## 2021-06-27 ENCOUNTER — Encounter (HOSPITAL_COMMUNITY)
Admission: RE | Admit: 2021-06-27 | Discharge: 2021-06-27 | Disposition: A | Discharge status: Home / Self Care | Payer: PPO | Source: Ambulatory Visit | Attending: Pulmonary Disease | Admitting: Pulmonary Disease

## 2021-06-27 VITALS — Wt 207.5 lb

## 2021-06-27 DIAGNOSIS — J449 Chronic obstructive pulmonary disease, unspecified: Secondary | ICD-10-CM

## 2021-06-27 NOTE — Progress Notes (Signed)
Daily Session Note ? ?Patient Details  ?Name: Benjamin [name] ?MRN: [id number] ?Date of Birth: 09/15/1950 ?Referring Provider:   ?Flowsheet Row Pulmonary Rehab Walk Test from 05/10/2021 in [place]  ?Referring Provider Icard  ? ?  ? ? ?Encounter Date: 06/27/2021 ? ?Check In: ? Session Check In - 06/27/21 1426   ? ?  ? Check-In  ? Supervising physician immediately available to respond to emergencies [facility] immediately available   ? Physician(s) Dr. Pietro Cassis   ? Location [facility]   ? Staff Present Rosebud Poles, RN, Milus Glazier, MS, ACSM-CEP, CCRP, Exercise Physiologist;Kaylee Rosana Hoes, MS, ACSM-CEP, Exercise Physiologist;Austynn Pridmore Ysidro Evert, RN   ? Virtual Visit No   ? Medication changes reported     No   ? Fall or balance concerns reported    No   ? Tobacco Cessation No Change   ? Warm-up and Cool-down Performed as group-led instruction   ? Resistance Training Performed Yes   ? VAD Patient? No   ? PAD/SET Patient? No   ?  ? Pain Assessment  ? Currently in Pain? No/denies   ? Multiple Pain Sites No   ? ?  ?  ? ?  ? ? ?Capillary Blood Glucose: ?No results found for this or any previous visit (from the past 24 hour(s)). ? ? ? ?Social History  ? ?Tobacco Use  ?Smoking Status Former  ? Packs/day: 2.00  ? Years: 30.00  ? Pack years: 60.00  ? Types: Cigarettes  ? Quit date: 1999  ? Years since quitting: 24.3  ?Smokeless Tobacco Never  ? ? ?Goals Met:  ?Proper associated with RPD/PD & O2 Sat ?Exercise tolerated well ?No report of concerns or symptoms today ?Strength training completed today ? ?Goals Unmet:  ?Not Applicable ? ?Comments: Service time is from 1315 to 1427 ? ? ? ?Dr. Rodman Pickle is Medical Director for Pulmonary Rehab at [facility].  ?

## 2021-06-28 ENCOUNTER — Telehealth: Payer: Self-pay | Admitting: Pulmonary Disease

## 2021-06-28 MED ORDER — TRELEGY ELLIPTA 100-62.5-25 MCG/ACT IN AEPB: 1.0000 | INHALATION_SPRAY | Freq: Every day | RESPIRATORY_TRACT | 11 refills | Status: DC

## 2021-06-28 NOTE — Progress Notes (Signed)
Pulmonary Individual Treatment Plan ? ?Patient Details  ?Name: Benjamin [name] ?MRN: [id number] ?Date of Birth: 11-27-1950 ?Referring Provider:   ?Flowsheet Row Pulmonary Rehab Walk Test from 05/10/2021 in [place]  ?Referring Provider Icard  ? ?  ? ? ?Initial Encounter Date:  ?Flowsheet Row Pulmonary Rehab Walk Test from 05/10/2021 in [place]  ?Date 05/10/21  ? ?  ? ? ?Visit Diagnosis: Stage 3 severe COPD by GOLD classification ([place]) ? ?Patient's Home Medications on Admission:  ? ?Current Outpatient Medications:  ?  albuterol (PROVENTIL) (2.5 MG/3ML) 0.083% nebulizer solution, Take 3 mLs (2.5 mg total) by nebulization every 4 (four) hours as needed for wheezing or shortness of breath., Disp: 75 mL, Rfl: 12 ?  albuterol (VENTOLIN HFA) 108 (90 Base) MCG/ACT inhaler, INHALE 2 PUFFS INTO THE LUNGS EVERY 6 HOURS AS NEEDED FOR WHEEZING OR SHORTNESS OF BREATH, Disp: 8.5 g, Rfl: 5 ?  alfuzosin (UROXATRAL) 10 MG 24 hr tablet, Take 10 mg by mouth daily with breakfast., Disp: , Rfl:  ?  Ascorbic Acid (VITAMIN C) 1000 MG tablet, Take 1,000 mg by mouth daily., Disp: , Rfl:  ?  aspirin 81 MG tablet, Take 81 mg by mouth daily., Disp: , Rfl:  ?  CIALIS 5 MG tablet, Take 5 mg by mouth daily as needed for erectile dysfunction., Disp: , Rfl:  ?  ibuprofen (ADVIL,MOTRIN) 600 MG tablet, Take 1 tablet (600 mg total) by mouth every 8 (eight) hours as needed., Disp: 15 tablet, Rfl: 0 ?  lisinopril (PRINIVIL,ZESTRIL) 20 MG tablet, Take 20 mg by mouth daily., Disp: , Rfl:  ?  Multiple Vitamin (MULTIVITAMIN) tablet, Take 1 tablet by mouth daily., Disp: , Rfl:  ? ?Past Medical History: ?Past Medical History:  ?Diagnosis Date  ? BPH (benign prostatic hyperplasia)   ? COPD (chronic obstructive pulmonary disease) ([place])   ? Diverticulosis   ? ED (erectile dysfunction)   ? External hemorrhoid   ? Hypertension   ? Internal hemorrhoid   ? Kidney stone   ? Personal history of colonic  adenomas 05/15/2012  ? ? ?Tobacco Use: ?Social History  ? ?Tobacco Use  ?Smoking Status Former  ? Packs/day: 2.00  ? Years: 30.00  ? Pack years: 60.00  ? Types: Cigarettes  ? Quit date: 1999  ? Years since quitting: 24.3  ?Smokeless Tobacco Never  ? ? ?Labs: ?Review Flowsheet   ? ?    ? View : No data to display.  ?  ?  ?  ?  ?  ? ? ?Capillary Blood Glucose: ?No results found for: GLUCAP ? ? ?Pulmonary Assessment Scores: ? Pulmonary Assessment Scores   ? ? [place] Name 05/10/21 1120  ?  ?  ?  ? ADL UCSD  ? ADL Phase Entry    ? SOB Score total 7    ?  ? CAT Score  ? CAT Score 10    ? ?  ?  ? ?  ? ?UCSD: ?Self-administered rating of dyspnea associated with activities of daily living (ADLs) ?6-point scale (0 = "not at all" to 5 = "maximal or unable to do because of breathlessness")  ?Scoring Scores range from 0 to 120.  Minimally important difference is 5 units ? ?CAT: ?CAT can identify the health impairment of COPD patients and is better correlated with disease progression.  ?CAT has a scoring range of zero to 40. The CAT score is classified into four groups of low (less  than 10), medium (10 - 20), high (21-30) and very high (31-40) based on the impact level of disease on health status. A CAT score over 10 suggests significant symptoms.  A worsening CAT score could be explained by an exacerbation, poor medication adherence, poor inhaler technique, or progression of COPD or comorbid conditions.  ?CAT MCID is 2 points ? ?mMRC: ?mMRC (Modified Medical Research Council) Dyspnea Scale is used to assess the degree of baseline functional disability in patients of respiratory disease due to dyspnea. ?No minimal important difference is established. A decrease in score of 1 point or greater is considered a positive change.  ? ?Pulmonary Function Assessment: ? Pulmonary Function Assessment - 05/10/21 1052   ? ?  ? Breath  ? Bilateral Breath Sounds Clear   ? Shortness of Breath Limiting activity;Yes   ? ?  ?  ? ?  ? ? ?Exercise Target  Goals: ?Exercise Program Goal: ?Individual exercise prescription set using results from initial 6 min walk test and THRR while considering  patient?s activity barriers and safety.  ? ?Exercise Prescription Goal: ?Initial exercise prescription builds to 30-45 minutes a day of aerobic activity, 2-3 days per week.  Home exercise guidelines will be given to patient during program as part of exercise prescription that the participant will acknowledge. ? ?Activity Barriers & Risk Stratification: ? Activity Barriers & Cardiac Risk Stratification - 05/10/21 1051   ? ?  ? Activity Barriers & Cardiac Risk Stratification  ? Activity Barriers Deconditioning;Muscular Weakness;Shortness of Breath   ? ?  ?  ? ?  ? ? ?6 Minute Walk: ? 6 Minute Walk   ? ? [place] Name 05/10/21 1139  ?  ?  ?  ? 6 Minute Walk  ? Phase Initial    ? Distance 1000 feet    ? Walk Time 6 minutes    ? # of Rest Breaks 1  2:30-3:10 to put on O2    ? MPH 1.89    ? METS 2.45    ? RPE 9    ? Perceived Dyspnea  1    ? VO2 Peak 8.56    ? Symptoms No    ? Resting HR 60 bpm    ? Resting BP 122/72    ? Resting Oxygen Saturation  92 %    ? Exercise Oxygen Saturation  during 6 min walk 84 %    ? Max Ex. HR 97 bpm    ? Max Ex. BP 142/78    ? 2 Minute Post BP 126/78    ?  ? Interval HR  ? 1 Minute HR 88    ? 2 Minute HR 97    ? 3 Minute HR 83    ? 4 Minute HR 87    ? 5 Minute HR 93    ? 6 Minute HR 76    ? 2 Minute Post HR 63    ? Interval Heart Rate? Yes    ?  ? Interval Oxygen  ? Interval Oxygen? Yes    ? Baseline Oxygen Saturation % 92 %    ? 1 Minute Oxygen Saturation % 87 %    ? 1 Minute Liters of Oxygen 0 L    ? 2 Minute Oxygen Saturation % 86 %  2:30: 84%    ? 2 Minute Liters of Oxygen 0 L  Increased to 1L after 2:30    ? 3 Minute Oxygen Saturation % 88 %    ? 3 Minute Liters  of Oxygen 1 L    ? 4 Minute Oxygen Saturation % 90 %    ? 4 Minute Liters of Oxygen 1 L    ? 5 Minute Oxygen Saturation % 88 %    ? 5 Minute Liters of Oxygen 1 L    ? 6 Minute Oxygen Saturation  % 88 %    ? 6 Minute Liters of Oxygen 1 L    ? 2 Minute Post Oxygen Saturation % 94 %    ? 2 Minute Post Liters of Oxygen 1 L    ? ?  ?  ? ?  ? ? ?Oxygen Initial Assessment: ? Oxygen Initial Assessment - 05/10/21 1052   ? ?  ? Home Oxygen  ? Home Oxygen Device None   ? Home Exercise Oxygen Prescription None   ? Home Resting Oxygen Prescription None   ? Compliance with Home Oxygen Use Yes   ?  ? Initial 6 min Walk  ? Oxygen Used Continuous   ? Liters per minute 1   ?  ? Program Oxygen Prescription  ? Program Oxygen Prescription Continuous   ? Liters per minute 1   ?  ? Intervention  ? Short Term Goals To learn and exhibit compliance with exercise, home and travel O2 prescription;To learn and understand importance of monitoring SPO2 with pulse oximeter and demonstrate accurate use of the pulse oximeter.;To learn and understand importance of maintaining oxygen saturations>88%;To learn and demonstrate proper pursed lip breathing techniques or other breathing techniques. ;To learn and demonstrate proper use of respiratory medications   ? Long  Term Goals Exhibits compliance with exercise, home  and travel O2 prescription;Verbalizes importance of monitoring SPO2 with pulse oximeter and return demonstration;Maintenance of O2 saturations>88%;Exhibits proper breathing techniques, such as pursed lip breathing or other method taught during program session;Compliance with respiratory medication;Demonstrates proper use of MDI?s   ? ?  ?  ? ?  ? ? ?Oxygen Re-Evaluation: ? Oxygen Re-Evaluation   ? ? [place] Name 05/23/21 0754 06/22/21 0907  ?  ?  ?  ?  ? Program Oxygen Prescription  ? Program Oxygen Prescription Continuous Pulsed;Continuous     ? Liters per minute 1 2     ? Comments I discussed with his pulmonologist the need for oxgyen. Dr. Valeta Harms has prescribed home O2 to [facility], but [facility] is unsure if he wants to accept home O2. --     ?  ? Home Oxygen  ? Home Oxygen Device None None     ? Home Exercise Oxygen Prescription None  Pulsed     ? Liters per minute -- 2     ? Home Resting Oxygen Prescription None None     ? Compliance with Home Oxygen Use Yes Yes     ?  ? Goals/Expected Outcomes  ? Short Term Goals To learn and exhibit co

## 2021-06-28 NOTE — Telephone Encounter (Signed)
Discussion: ?This is a 71 year old gentleman, former smoker, quit several years ago, 30-pack-year history, previous pulmonary function test with an FEV1 of 39% predicted.  Currently on triple therapy inhaler regimen ? ?Plan: ?Continue Trelegy inhaler ?Continue vitamin D supplementation ?Walk today for oxygen requirements. ?Goal O2 saturation greater than 88%. ?Likely qualifies with oxygen with exertion. ?Complete overnight pulse oximetry testing. ?If he needs this completed on room air we may need to consider continuous O2 supplementation at night. ?Follow-up with [place] in 1 year or as needed. ?  ? ? ? ?Somehow Rx for pt's Trelegy was removed off of med list. New Rx has been sent to preferred pharmacy for pt. Called and spoke with pt letting him know this had been done and he verbalized understanding. Nothing further needed. ?

## 2021-06-29 ENCOUNTER — Encounter (HOSPITAL_COMMUNITY): Payer: PPO

## 2021-06-29 ENCOUNTER — Telehealth (HOSPITAL_COMMUNITY): Payer: Self-pay | Admitting: Family Medicine

## 2021-07-04 ENCOUNTER — Encounter (HOSPITAL_COMMUNITY): Payer: PPO

## 2021-07-05 DIAGNOSIS — R0602 Shortness of breath: Secondary | ICD-10-CM | POA: Diagnosis not present

## 2021-07-06 ENCOUNTER — Encounter (HOSPITAL_COMMUNITY): Payer: PPO

## 2021-07-11 ENCOUNTER — Encounter (HOSPITAL_COMMUNITY)
Admission: RE | Admit: 2021-07-11 | Discharge: 2021-07-11 | Disposition: A | Discharge status: Home / Self Care | Payer: PPO | Source: Ambulatory Visit | Attending: Pulmonary Disease | Admitting: Pulmonary Disease

## 2021-07-11 DIAGNOSIS — J449 Chronic obstructive pulmonary disease, unspecified: Secondary | ICD-10-CM | POA: Diagnosis not present

## 2021-07-11 NOTE — Progress Notes (Signed)
Daily Session Note ? ?Patient Details  ?Name: Dawson Albers Lovins ?MRN: [id number] ?Date of Birth: September 03, 1950 ?Referring Provider:   ?Flowsheet Row Pulmonary Rehab Walk Test from 05/10/2021 in [facility]  ?Referring Provider Icard  ? ?  ? ? ?Encounter Date: 07/11/2021 ? ?Check In: ? Session Check In - 07/11/21 1500   ? ?  ? Check-In  ? Supervising physician immediately available to respond to emergencies [facility] immediately available   ? Physician(s) Dr. Karleen Hampshire   ? Location [facility]   ? Staff Present Rosebud Poles, RN, Milus Glazier, MS, ACSM-CEP, CCRP, Exercise Physiologist;Kaylee Rosana Hoes, MS, ACSM-CEP, Exercise Physiologist;Carlette Wilber Oliphant, RN, BSN   ? Virtual Visit No   ? Medication changes reported     No   ? Fall or balance concerns reported    No   ? Tobacco Cessation No Change   ? Warm-up and Cool-down Performed as group-led instruction   ? Resistance Training Performed Yes   ? VAD Patient? No   ? PAD/SET Patient? No   ?  ? Pain Assessment  ? Currently in Pain? No/denies   ? Multiple Pain Sites No   ? ?  ?  ? ?  ? ? ?Capillary Blood Glucose: ?No results found for this or any previous visit (from the past 24 hour(s)). ? ? ? ?Social History  ? ?Tobacco Use  ?Smoking Status Former  ? Packs/day: 2.00  ? Years: 30.00  ? Pack years: 60.00  ? Types: Cigarettes  ? Quit date: 1999  ? Years since quitting: 24.3  ?Smokeless Tobacco Never  ? ? ?Goals Met:  ?Proper associated with RPD/PD & O2 Sat ?Exercise tolerated well ?No report of concerns or symptoms today ?Strength training completed today ? ?Goals Unmet:  ?Not Applicable ? ?Comments: Service time is from 1320 to 1435 ? ? ? ?Dr. Rodman Pickle is Medical Director for Pulmonary Rehab at [facility].  ?

## 2021-07-13 ENCOUNTER — Encounter (HOSPITAL_COMMUNITY)
Admission: RE | Admit: 2021-07-13 | Discharge: 2021-07-13 | Disposition: A | Discharge status: Home / Self Care | Payer: PPO | Source: Ambulatory Visit | Attending: Pulmonary Disease | Admitting: Pulmonary Disease

## 2021-07-13 DIAGNOSIS — J449 Chronic obstructive pulmonary disease, unspecified: Secondary | ICD-10-CM

## 2021-07-13 NOTE — Progress Notes (Signed)
Daily Session Note ? ?Patient Details  ?Name: Benjamin [name] ?MRN: [id number] ?Date of Birth: 17-Jul-1950 ?Referring Provider:   ?Flowsheet Row Pulmonary Rehab Walk Test from 05/10/2021 in [employer]  ?Referring Provider Icard  ? ?  ? ? ?Encounter Date: 07/13/2021 ? ?Check In: ? Session Check In - 07/13/21 1429   ? ?  ? Check-In  ? Physician(s) Dr. Sloan Leiter   ? Location [facility]   ? Staff Present Elmon Else, MS, ACSM-CEP, Exercise Physiologist;Carlette Wilber Oliphant, RN, BSN;Ramon Dredge, RN, MHA;Jetta Walker BS, ACSM EP-C, Exercise Physiologist   ? Virtual Visit No   ? Medication changes reported     No   ? Fall or balance concerns reported    No   ? Tobacco Cessation No Change   ? Warm-up and Cool-down Performed as group-led instruction   ? Resistance Training Performed Yes   ? VAD Patient? No   ? PAD/SET Patient? No   ?  ? Pain Assessment  ? Currently in Pain? No/denies   ? ?  ?  ? ?  ? ? ?Capillary Blood Glucose: ?No results found for this or any previous visit (from the past 24 hour(s)). ? ? ? ?Social History  ? ?Tobacco Use  ?Smoking Status Former  ? Packs/day: 2.00  ? Years: 30.00  ? Pack years: 60.00  ? Types: Cigarettes  ? Quit date: 1999  ? Years since quitting: 24.3  ?Smokeless Tobacco Never  ? ? ?Goals Met:  ?Proper associated with RPD/PD & O2 Sat ?Exercise tolerated well ?No report of concerns or symptoms today ?Strength training completed today ? ?Goals Unmet:  ?Not Applicable ? ?Comments: Service time is from 1315 to 1445.  ? ? ?Dr. Rodman Pickle is Medical Director for Pulmonary Rehab at [facility].  ?

## 2021-07-18 NOTE — Progress Notes (Signed)
Discharge Progress Report ? ?Patient Details  ?Name: Benjamin [name] ?MRN: [id number] ?Date of Birth: Dec 12, 1950 ?Referring Provider:   ?Flowsheet Row Pulmonary Rehab Walk Test from 05/10/2021 in [place]  ?Referring Provider Icard  ? ?  ? ? ? ?Number of Visits: 14 ? ?Reason for Discharge:  ?Patient has met program and personal goals. ? ?Smoking History:  ?Social History  ? ?Tobacco Use  ?Smoking Status Former  ? Packs/day: 2.00  ? Years: 30.00  ? Pack years: 60.00  ? Types: Cigarettes  ? Quit date: 1999  ? Years since quitting: 24.3  ?Smokeless Tobacco Never  ? ? ?Diagnosis:  ?Stage 3 severe COPD by GOLD classification ([place]) ? ?ADL UCSD: ? Pulmonary Assessment Scores   ? ? [place] Name 05/10/21 1120 06/30/21 1421 07/13/21 1701  ?  ? ADL UCSD  ? ADL Phase Entry Exit --  ? SOB Score total 7 24 --  ?  ? CAT Score  ? CAT Score 10 2 --  ?  ? mMRC Score  ? mMRC Score -- -- 1  ? ?  ?  ? ?  ? ? ?Initial Exercise Prescription: ? Initial Exercise Prescription - 05/10/21 1100   ? ?  ? Date of Initial Exercise RX and Referring Provider  ? Date 05/10/21   ? Referring Provider Icard   ? Expected Discharge Date 07/13/21   ?  ? Oxygen  ? Oxygen Continuous   ? Liters 1   ? Maintain Oxygen Saturation 88% or higher   ?  ? NuStep  ? Level 1   ? SPM 70   ? Minutes 15   ?  ? Arm Ergometer  ? Level 1   ? Watts 5   ? Minutes 15   ?  ? Prescription Details  ? Frequency (times per week) 2   ? Duration Progress to 30 minutes of continuous aerobic without signs/symptoms of physical distress   ?  ? Intensity  ? THRR 40-80% of Max Heartrate 61-122   ? Ratings of Perceived Exertion 11-13   ? Perceived Dyspnea 0-4   ?  ? Progression  ? Progression Continue to progress workloads to maintain intensity without signs/symptoms of physical distress.   ?  ? Resistance Training  ? Training Prescription Yes   ? Weight blue bands   ? Reps 10-15   ? ?  ?  ? ?  ? ? ?Discharge Exercise Prescription (Final Exercise Prescription  Changes): ? Exercise Prescription Changes - 06/27/21 1524   ? ?  ? Response to Exercise  ? Blood Pressure (Admit) 124/80   ? Blood Pressure (Exit) 116/80   ? Heart Rate (Admit) 76 bpm   ? Heart Rate (Exercise) 88 bpm   ? Heart Rate (Exit) 66 bpm   ? Oxygen Saturation (Admit) 95 %   ? Oxygen Saturation (Exercise) 94 %   ? Oxygen Saturation (Exit) 97 %   ? Rating of Perceived Exertion (Exercise) 11   ? Perceived Dyspnea (Exercise) 1   ? Duration Continue with 30 min of aerobic exercise without signs/symptoms of physical distress.   ? Intensity THRR unchanged   ?  ? Resistance Training  ? Training Prescription Yes   ? Weight blue bands   ? Reps 10-15   ? Time 10 Minutes   ?  ? Oxygen  ? Oxygen Continuous   ? Liters 2   ?  ? NuStep  ? Level 3   ? SPM  80   ? Minutes 15   ? METs 1.8   ?  ? Arm Ergometer  ? Level 3   ? Minutes 15   ? METs 2   ? ?  ?  ? ?  ? ? ?Functional Capacity: ? 6 Minute Walk   ? ? [place] Name 05/10/21 1139 07/13/21 1638  ?  ?  ? 6 Minute Walk  ? Phase Initial Discharge   ? Distance 1000 feet 990 feet   ? Distance % Change -- -1 %   ? Distance Feet Change -- -10 ft   ? Walk Time 6 minutes 6 minutes   ? # of Rest Breaks 1  2:30-3:10 to put on O2 0   ? MPH 1.89 1.88   ? METS 2.45 2.59   ? RPE 9 9.08   ? Perceived Dyspnea  1 1   ? VO2 Peak 8.56 9.08   ? Symptoms No No   ? Resting HR 60 bpm 76 bpm   ? Resting BP 122/72 120/78   ? Resting Oxygen Saturation  92 % 99 %   ? Exercise Oxygen Saturation  during 6 min walk 84 % 88 %   ? Max Ex. HR 97 bpm 105 bpm   ? Max Ex. BP 142/78 150/80   ? 2 Minute Post BP 126/78 132/78   ?  ? Interval HR  ? 1 Minute HR 88 94   ? 2 Minute HR 97 105   ? 3 Minute HR 83 97   ? 4 Minute HR 87 102   ? 5 Minute HR 93 98   ? 6 Minute HR 76 88   ? 2 Minute Post HR 63 83   ? Interval Heart Rate? Yes Yes   ?  ? Interval Oxygen  ? Interval Oxygen? Yes Yes   ? Baseline Oxygen Saturation % 92 % 99 %   ? 1 Minute Oxygen Saturation % 87 % 93 %   ? 1 Minute Liters of Oxygen 0 L 2 L   ? 2 Minute  Oxygen Saturation % 86 %  2:30: 84% 91 %   ? 2 Minute Liters of Oxygen 0 L  Increased to 1L after 2:30 2 L   ? 3 Minute Oxygen Saturation % 88 % 89 %   ? 3 Minute Liters of Oxygen 1 L 2 L   ? 4 Minute Oxygen Saturation % 90 % 88 %   ? 4 Minute Liters of Oxygen 1 L 2 L   ? 5 Minute Oxygen Saturation % 88 % 88 %   ? 5 Minute Liters of Oxygen 1 L 2 L   ? 6 Minute Oxygen Saturation % 88 % 88 %   ? 6 Minute Liters of Oxygen 1 L 2 L   ? 2 Minute Post Oxygen Saturation % 94 % 94 %   ? 2 Minute Post Liters of Oxygen 1 L 2 L   ? ?  ?  ? ?  ? ? ?Psychological, QOL, Others - Outcomes: ?PHQ 2/9: ? ?  07/13/2021  ?  1:07 PM 05/10/2021  ? 10:50 AM  ?Depression screen PHQ 2/9  ?Decreased Interest 0 0  ?Down, Depressed, Hopeless 0 0  ?PHQ - 2 Score 0 0  ?Altered sleeping 0 0  ?Tired, decreased energy 0 0  ?Change in appetite 0 0  ?Feeling bad or failure about yourself  0 0  ?Trouble concentrating 0 0  ?Moving slowly or fidgety/restless  0 0  ?Suicidal thoughts 0 0  ?PHQ-9 Score 0 0  ?Difficult doing work/chores Not difficult at all Not difficult at all  ? ? ?Quality of Life: ? ? ?Personal Goals: ?Goals established at orientation with interventions provided to work toward goal. ? Personal Goals and Risk Factors at Admission - 05/10/21 1122   ? ?  ? Core Components/Risk Factors/Patient Goals on Admission  ? Improve shortness of breath with ADL's Yes   ? Intervention Provide education, individualized exercise plan and daily activity instruction to help decrease symptoms of SOB with activities of daily living.   ? Expected Outcomes Short Term: Improve cardiorespiratory fitness to achieve a reduction of symptoms when performing ADLs;Long Term: Be able to perform more ADLs without symptoms or delay the onset of symptoms   ? Increase knowledge of respiratory medications and ability to use respiratory devices properly  Yes   ? Intervention Provide education and demonstration as needed of appropriate use of medications, inhalers, and oxygen  therapy.   ? Expected Outcomes Short Term: Achieves understanding of medications use. Understands that oxygen is a medication prescribed by physician. Demonstrates appropriate use of inhaler and oxygen therapy.;Long Term: Maintain appropriate use of medications, inhalers, and oxygen therapy.   ? ?  ?  ? ?  ?  ? ?Personal Goals Discharge: ? Goals and Risk Factor Review   ? ? [place] Name 05/10/21 1122 05/30/21 1551 06/20/21 0940  ?  ?  ?  ? Core Components/Risk Factors/Patient Goals Review  ? Personal Goals Review Develop more efficient breathing techniques such as purse lipped breathing and diaphragmatic breathing and practicing self-pacing with activity.;Increase knowledge of respiratory medications and ability to use respiratory devices properly.;Improve shortness of breath with ADL's Develop more efficient breathing techniques such as purse lipped breathing and diaphragmatic breathing and practicing self-pacing with activity.;Increase knowledge of respiratory medications and ability to use respiratory devices properly.;Improve shortness of breath with ADL's Develop more efficient breathing techniques such as purse lipped breathing and diaphragmatic breathing and practicing self-pacing with activity.;Increase knowledge of respiratory medications and ability to use respiratory devices properly.;Improve shortness of breath with ADL's    ? Review -- Krish requires 1L of supplemental oxygen while exercising in pulmonary reab.  He is reluctant to get an order for it from his doctor to use at home when exercising.  He is getting closer to acceptance and is considering using it at home.  He is progressing slowly with his workload increases. Westin has improved his strength and stamina and has learned his safety paramenters for exercising on his own.  His attendance has been excellent and he works hard when he is exercising in pulmonary rehab. He is exercising @ 1.9 mets on both the arm ergomenter and the nustep.    ?  Expected Outcomes -- See admission goals. See admission goals.    ? ?  ?  ? ?  ? ? ?Exercise Goals and Review: ? Exercise Goals   ? ? [place] Name 05/10/21 1044 05/23/21 0751 06/22/21 0904  ?  ?  ?  ? Exercise Goals  ?

## 2021-08-05 DIAGNOSIS — R0602 Shortness of breath: Secondary | ICD-10-CM | POA: Diagnosis not present

## 2021-08-18 DIAGNOSIS — L738 Other specified follicular disorders: Secondary | ICD-10-CM | POA: Diagnosis not present

## 2021-08-18 DIAGNOSIS — Z85828 Personal history of other malignant neoplasm of skin: Secondary | ICD-10-CM | POA: Diagnosis not present

## 2021-08-18 DIAGNOSIS — L814 Other melanin hyperpigmentation: Secondary | ICD-10-CM | POA: Diagnosis not present

## 2021-08-18 DIAGNOSIS — D485 Neoplasm of uncertain behavior of skin: Secondary | ICD-10-CM | POA: Diagnosis not present

## 2021-08-18 DIAGNOSIS — L820 Inflamed seborrheic keratosis: Secondary | ICD-10-CM | POA: Diagnosis not present

## 2021-08-18 DIAGNOSIS — D2272 Melanocytic nevi of left lower limb, including hip: Secondary | ICD-10-CM | POA: Diagnosis not present

## 2021-08-18 DIAGNOSIS — L570 Actinic keratosis: Secondary | ICD-10-CM | POA: Diagnosis not present

## 2021-08-18 DIAGNOSIS — L821 Other seborrheic keratosis: Secondary | ICD-10-CM | POA: Diagnosis not present

## 2021-08-18 DIAGNOSIS — D225 Melanocytic nevi of trunk: Secondary | ICD-10-CM | POA: Diagnosis not present

## 2021-09-04 DIAGNOSIS — R0602 Shortness of breath: Secondary | ICD-10-CM | POA: Diagnosis not present

## 2021-09-06 ENCOUNTER — Other Ambulatory Visit: Payer: Self-pay | Admitting: Gastroenterology

## 2021-09-06 DIAGNOSIS — K7460 Unspecified cirrhosis of liver: Secondary | ICD-10-CM

## 2021-09-12 ENCOUNTER — Other Ambulatory Visit: Payer: PPO

## 2021-09-22 ENCOUNTER — Ambulatory Visit
Admission: RE | Admit: 2021-09-22 | Discharge: 2021-09-22 | Disposition: A | Discharge status: Home / Self Care | Payer: PPO | Source: Ambulatory Visit | Attending: Gastroenterology | Admitting: Gastroenterology

## 2021-09-22 DIAGNOSIS — K7460 Unspecified cirrhosis of liver: Secondary | ICD-10-CM

## 2021-09-22 DIAGNOSIS — R188 Other ascites: Secondary | ICD-10-CM | POA: Diagnosis not present

## 2021-10-05 DIAGNOSIS — R0602 Shortness of breath: Secondary | ICD-10-CM | POA: Diagnosis not present

## 2021-10-27 ENCOUNTER — Other Ambulatory Visit: Payer: Self-pay | Admitting: *Deleted

## 2021-10-27 MED ORDER — ALBUTEROL SULFATE HFA 108 (90 BASE) MCG/ACT IN AERS: 2.0000 | INHALATION_SPRAY | Freq: Four times a day (QID) | RESPIRATORY_TRACT | 5 refills | Status: DC | PRN

## 2021-11-05 DIAGNOSIS — R0602 Shortness of breath: Secondary | ICD-10-CM | POA: Diagnosis not present

## 2021-11-08 DIAGNOSIS — H2513 Age-related nuclear cataract, bilateral: Secondary | ICD-10-CM | POA: Diagnosis not present

## 2021-11-08 DIAGNOSIS — H524 Presbyopia: Secondary | ICD-10-CM | POA: Diagnosis not present

## 2021-12-05 DIAGNOSIS — R0602 Shortness of breath: Secondary | ICD-10-CM | POA: Diagnosis not present

## 2021-12-07 DIAGNOSIS — K7460 Unspecified cirrhosis of liver: Secondary | ICD-10-CM | POA: Diagnosis not present

## 2021-12-07 DIAGNOSIS — K2930 Chronic superficial gastritis without bleeding: Secondary | ICD-10-CM | POA: Diagnosis not present

## 2021-12-07 DIAGNOSIS — K31A19 Gastric intestinal metaplasia without dysplasia, unspecified site: Secondary | ICD-10-CM | POA: Diagnosis not present

## 2021-12-08 DIAGNOSIS — K2930 Chronic superficial gastritis without bleeding: Secondary | ICD-10-CM | POA: Diagnosis not present

## 2021-12-08 DIAGNOSIS — K31A19 Gastric intestinal metaplasia without dysplasia, unspecified site: Secondary | ICD-10-CM | POA: Diagnosis not present

## 2021-12-15 DIAGNOSIS — K2930 Chronic superficial gastritis without bleeding: Secondary | ICD-10-CM | POA: Diagnosis not present

## 2021-12-15 DIAGNOSIS — K31A19 Gastric intestinal metaplasia without dysplasia, unspecified site: Secondary | ICD-10-CM | POA: Diagnosis not present

## 2022-01-05 DIAGNOSIS — R0602 Shortness of breath: Secondary | ICD-10-CM | POA: Diagnosis not present

## 2022-01-19 IMAGING — US US ABDOMEN LIMITED
1 series · 14 of 25 positions shown · non-contrast
Comparison: Ultrasound dated 02/15/2020.

CLINICAL DATA: 70-year-old male with hepatitis C.

EXAM:
ULTRASOUND ABDOMEN LIMITED RIGHT UPPER QUADRANT

[Series 1: us abdomen limited · 0.22mm/px · 14 of 55 slices shown]
[im 1/55]
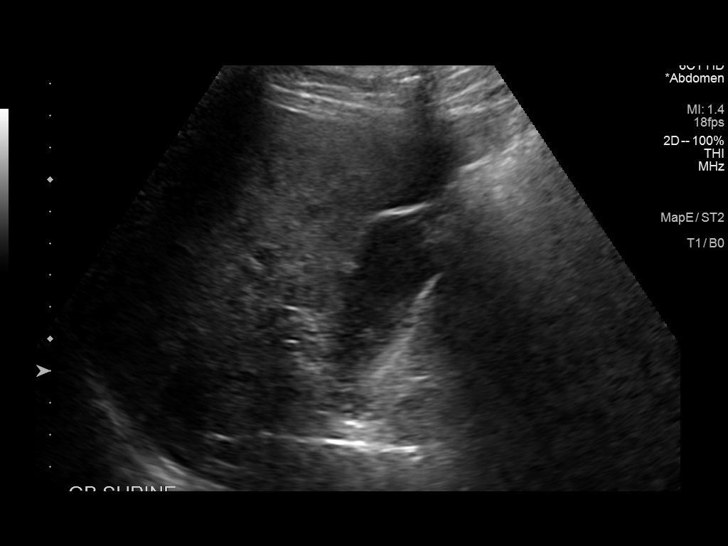
[im 5/55]
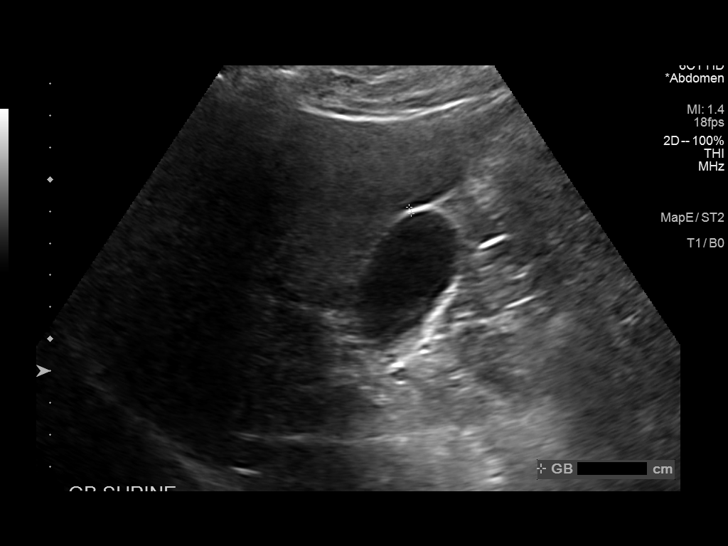
[im 10/55]
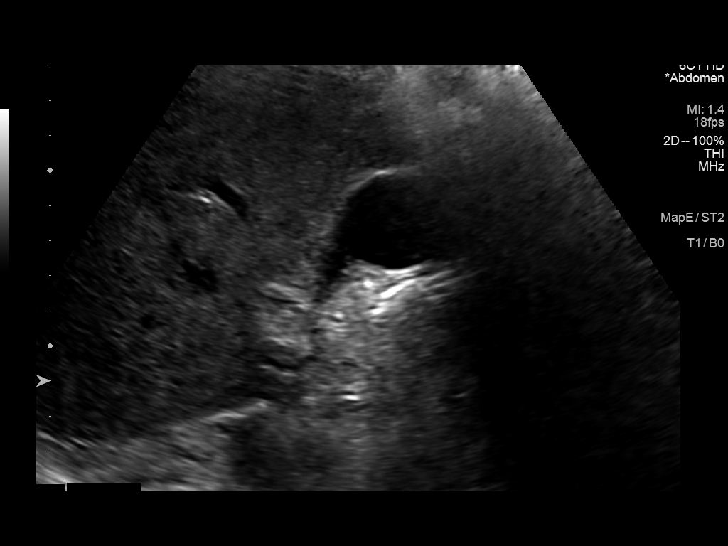
[im 14/55]
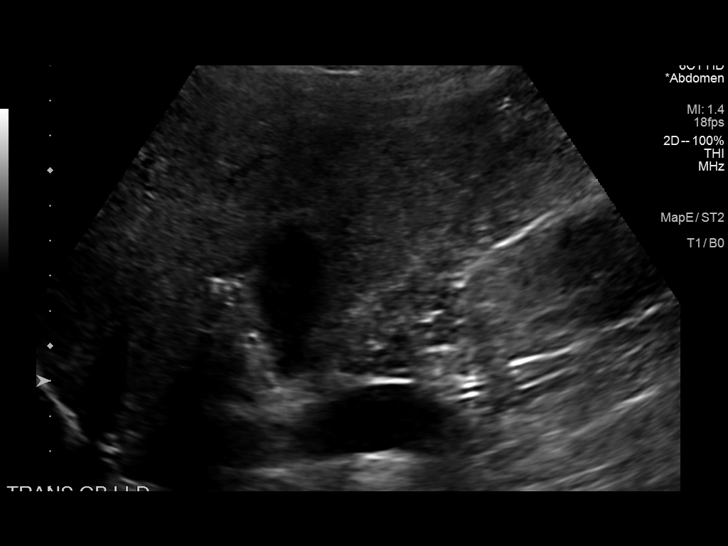
[im 19/55]
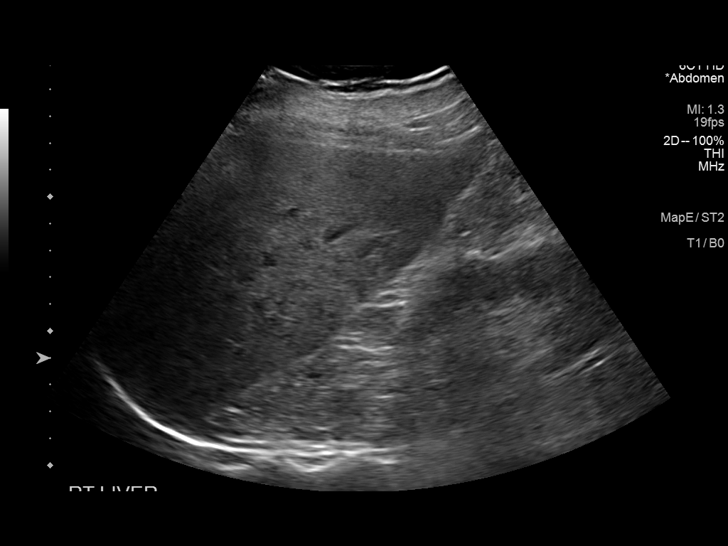
[im 21/55]
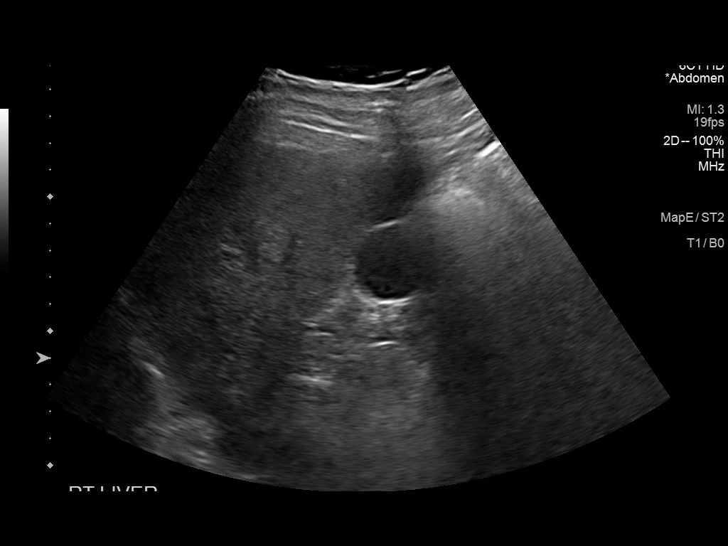
[im 25/55]
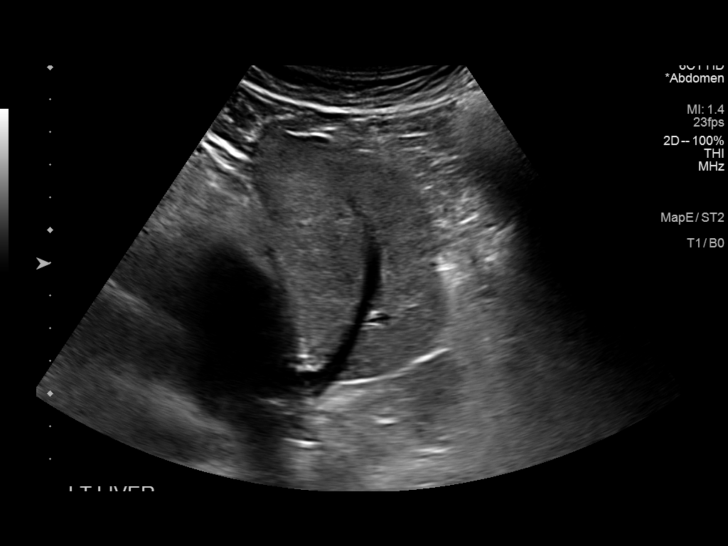
[im 30/55]
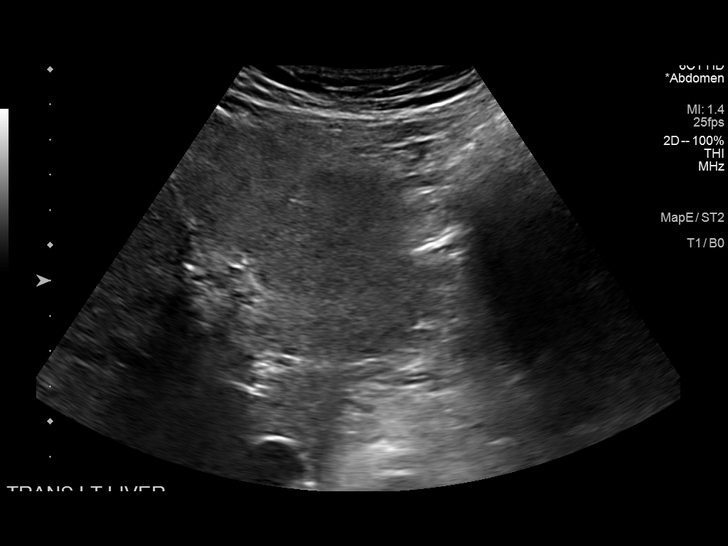
[im 34/55]
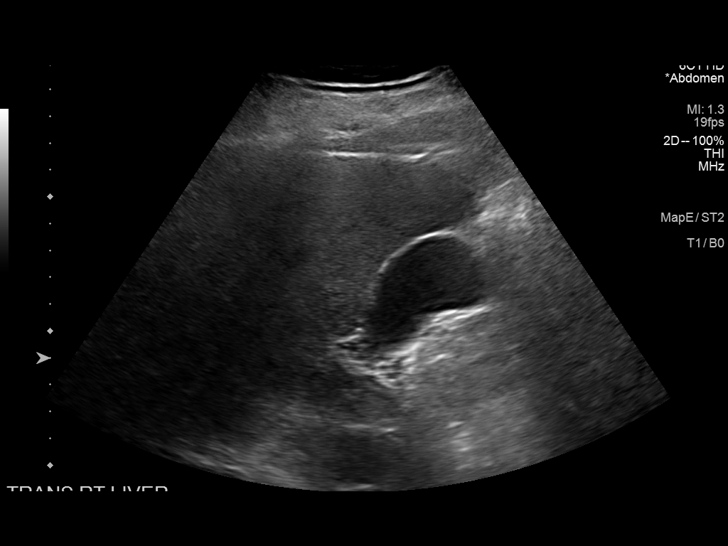
[im 37/55]
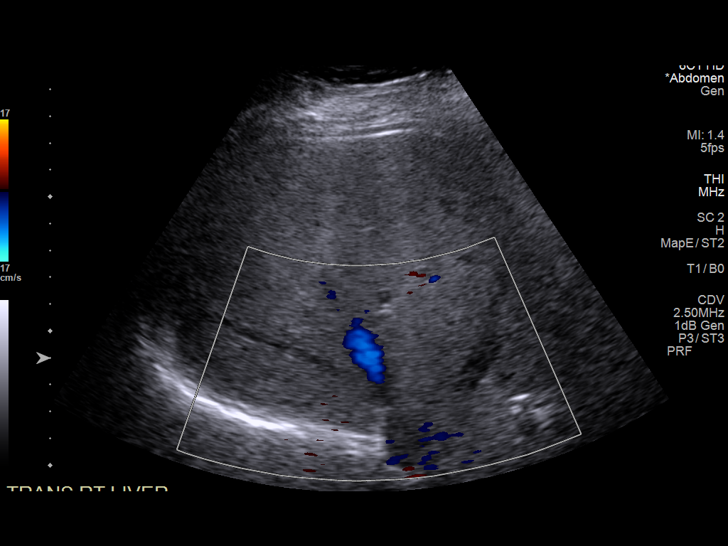
[im 41/55]
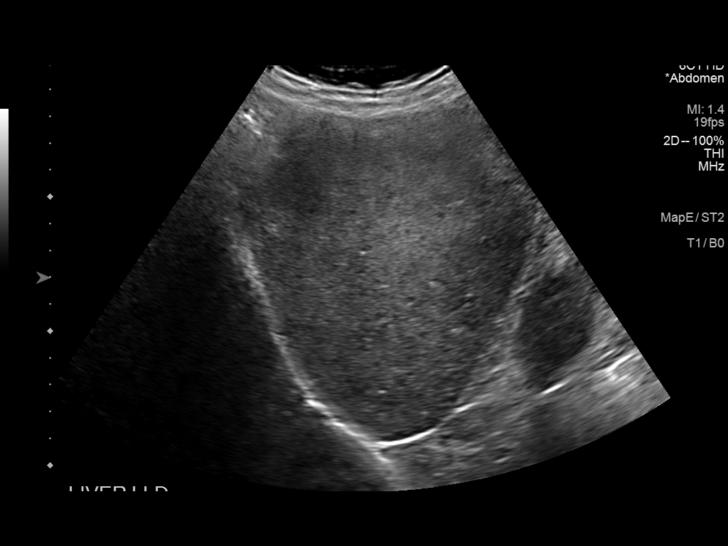
[im 46/55]
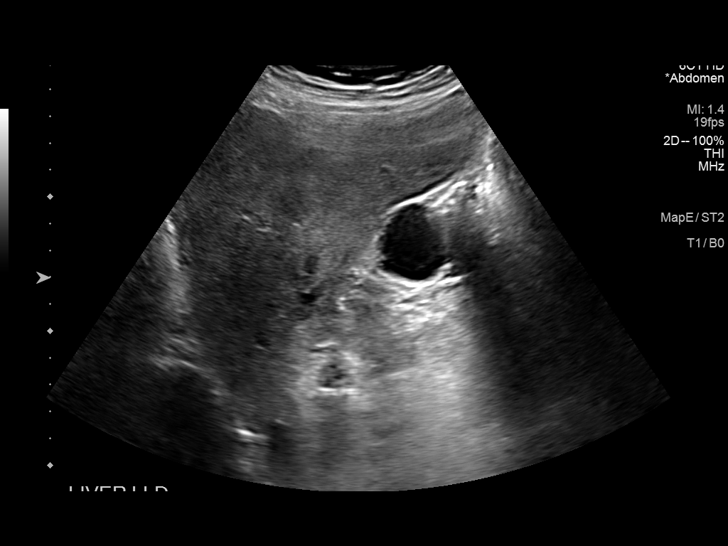
[im 50/55]
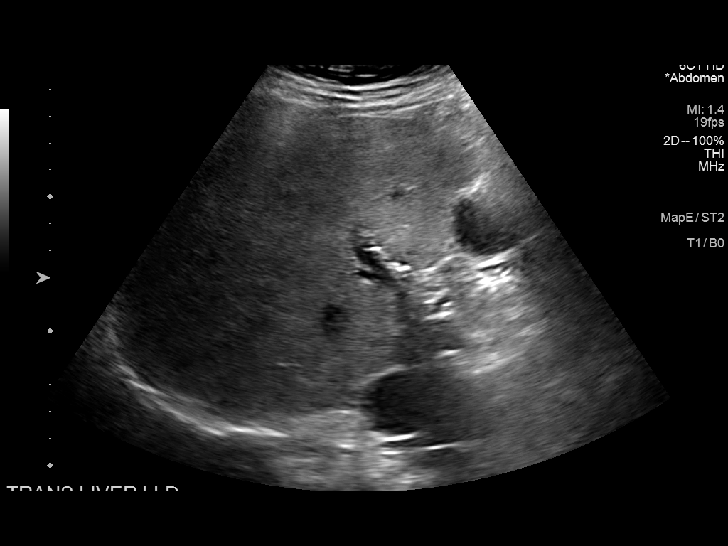
[im 55/55]
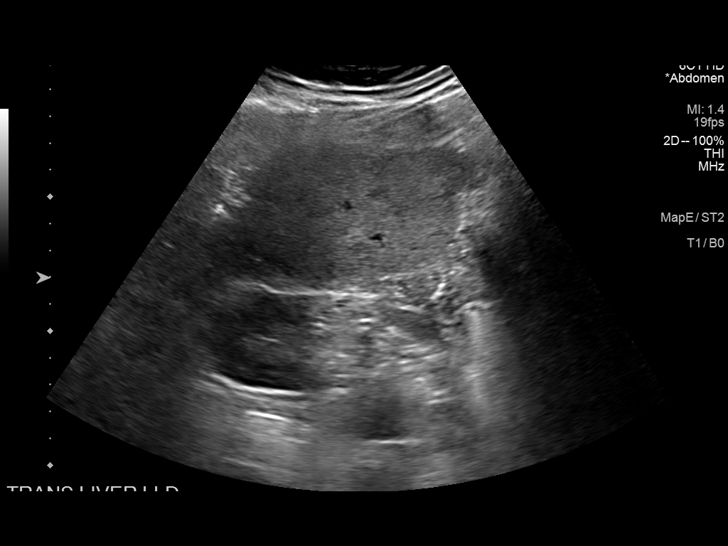

[14 of 25 positions shown; findings below may reference images not displayed]

FINDINGS: Gallbladder:

No gallstones or wall thickening visualized. No sonographic Murphy
sign noted by sonographer.

Common bile duct:

Diameter: 4 mm

Liver:

There is diffuse increased liver echogenicity with coarsened
echotexture in keeping with provided history fibrosis. No focal
mass. Portal vein is patent on color Doppler imaging with normal
direction of blood flow towards the liver.

Other: None.
IMPRESSION: 1. Probable background of liver fibrosis/cirrhosis.  No focal mass.
2. Patent main portal vein with hepatopetal flow.
3. No ascites.

## 2022-02-04 DIAGNOSIS — R0602 Shortness of breath: Secondary | ICD-10-CM | POA: Diagnosis not present

## 2022-02-06 DIAGNOSIS — K7460 Unspecified cirrhosis of liver: Secondary | ICD-10-CM | POA: Diagnosis not present

## 2022-02-06 DIAGNOSIS — B182 Chronic viral hepatitis C: Secondary | ICD-10-CM | POA: Diagnosis not present

## 2022-02-06 DIAGNOSIS — J449 Chronic obstructive pulmonary disease, unspecified: Secondary | ICD-10-CM | POA: Diagnosis not present

## 2022-02-06 DIAGNOSIS — K7402 Hepatic fibrosis, advanced fibrosis: Secondary | ICD-10-CM | POA: Diagnosis not present

## 2022-02-06 DIAGNOSIS — I10 Essential (primary) hypertension: Secondary | ICD-10-CM | POA: Diagnosis not present

## 2022-02-06 DIAGNOSIS — Z0000 Encounter for general adult medical examination without abnormal findings: Secondary | ICD-10-CM | POA: Diagnosis not present

## 2022-02-06 DIAGNOSIS — N529 Male erectile dysfunction, unspecified: Secondary | ICD-10-CM | POA: Diagnosis not present

## 2022-02-06 DIAGNOSIS — N400 Enlarged prostate without lower urinary tract symptoms: Secondary | ICD-10-CM | POA: Diagnosis not present

## 2022-02-19 DIAGNOSIS — Z85828 Personal history of other malignant neoplasm of skin: Secondary | ICD-10-CM | POA: Diagnosis not present

## 2022-02-19 DIAGNOSIS — L821 Other seborrheic keratosis: Secondary | ICD-10-CM | POA: Diagnosis not present

## 2022-02-19 DIAGNOSIS — L820 Inflamed seborrheic keratosis: Secondary | ICD-10-CM | POA: Diagnosis not present

## 2022-02-19 DIAGNOSIS — L738 Other specified follicular disorders: Secondary | ICD-10-CM | POA: Diagnosis not present

## 2022-02-19 DIAGNOSIS — B070 Plantar wart: Secondary | ICD-10-CM | POA: Diagnosis not present

## 2022-02-19 DIAGNOSIS — D225 Melanocytic nevi of trunk: Secondary | ICD-10-CM | POA: Diagnosis not present

## 2022-02-19 DIAGNOSIS — L814 Other melanin hyperpigmentation: Secondary | ICD-10-CM | POA: Diagnosis not present

## 2022-03-06 ENCOUNTER — Other Ambulatory Visit: Payer: Self-pay | Admitting: Gastroenterology

## 2022-03-06 DIAGNOSIS — K7402 Hepatic fibrosis, advanced fibrosis: Secondary | ICD-10-CM

## 2022-03-07 DIAGNOSIS — R0602 Shortness of breath: Secondary | ICD-10-CM | POA: Diagnosis not present

## 2022-03-29 ENCOUNTER — Ambulatory Visit
Admission: RE | Admit: 2022-03-29 | Discharge: 2022-03-29 | Disposition: A | Discharge status: Home / Self Care | Payer: PPO | Source: Ambulatory Visit | Attending: Gastroenterology | Admitting: Gastroenterology

## 2022-03-29 DIAGNOSIS — K7402 Hepatic fibrosis, advanced fibrosis: Secondary | ICD-10-CM

## 2022-03-29 DIAGNOSIS — K8020 Calculus of gallbladder without cholecystitis without obstruction: Secondary | ICD-10-CM | POA: Diagnosis not present

## 2022-04-06 DIAGNOSIS — K7402 Hepatic fibrosis, advanced fibrosis: Secondary | ICD-10-CM | POA: Diagnosis not present

## 2022-04-06 DIAGNOSIS — Z86010 Personal history of colonic polyps: Secondary | ICD-10-CM | POA: Diagnosis not present

## 2022-04-06 DIAGNOSIS — K31A0 Gastric intestinal metaplasia, unspecified: Secondary | ICD-10-CM | POA: Diagnosis not present

## 2022-04-07 DIAGNOSIS — R0602 Shortness of breath: Secondary | ICD-10-CM | POA: Diagnosis not present

## 2022-06-28 ENCOUNTER — Other Ambulatory Visit: Payer: Self-pay | Admitting: Pulmonary Disease

## 2022-07-17 IMAGING — US US ABDOMEN LIMITED
1 series · 14 of 25 positions shown · non-contrast
Comparison: 09/16/2020

CLINICAL DATA: Chronic hepatitis C

EXAM:
ULTRASOUND ABDOMEN LIMITED RIGHT UPPER QUADRANT

[Series 1: us abdomen limited · 0.22mm/px · 14 of 46 slices shown]
[im 1/46]
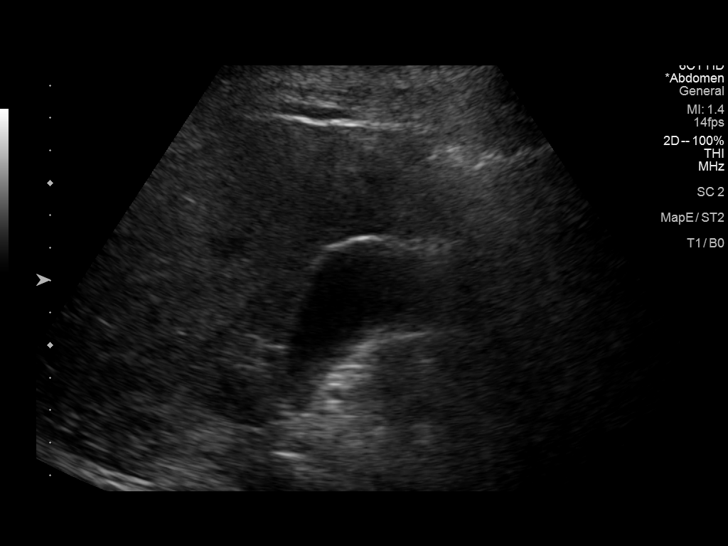
[im 4/46]
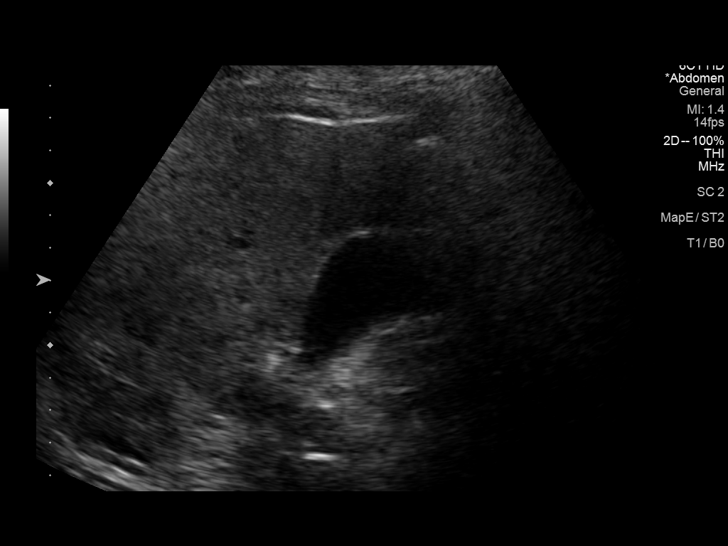
[im 8/46]
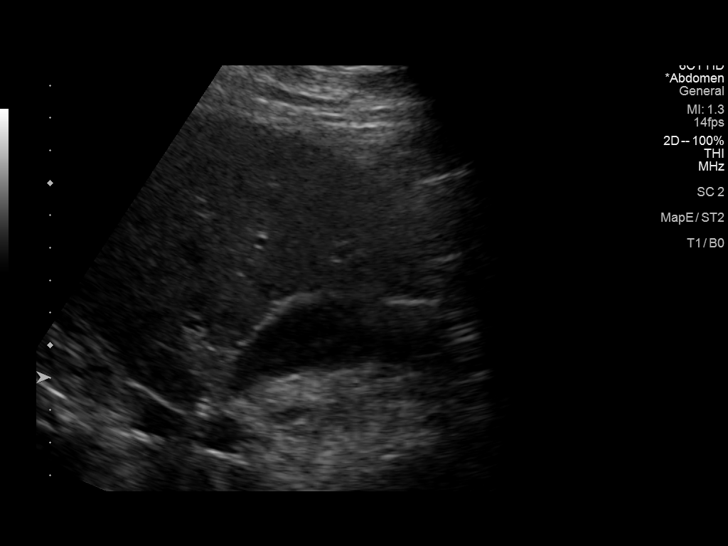
[im 12/46]
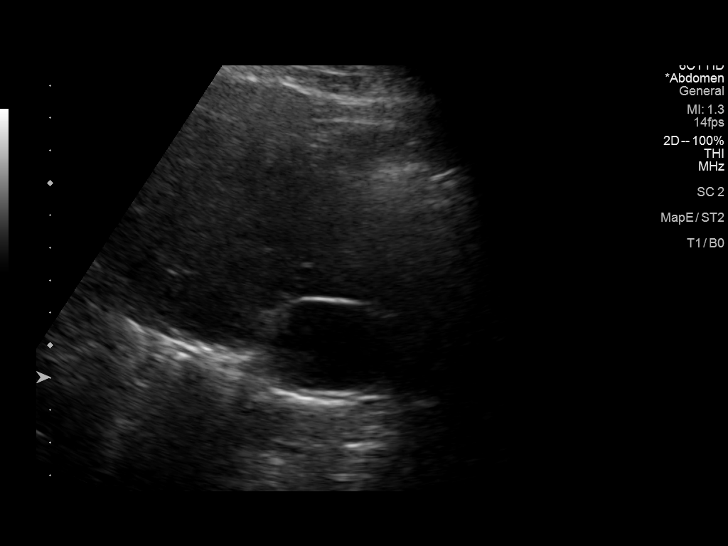
[im 16/46]
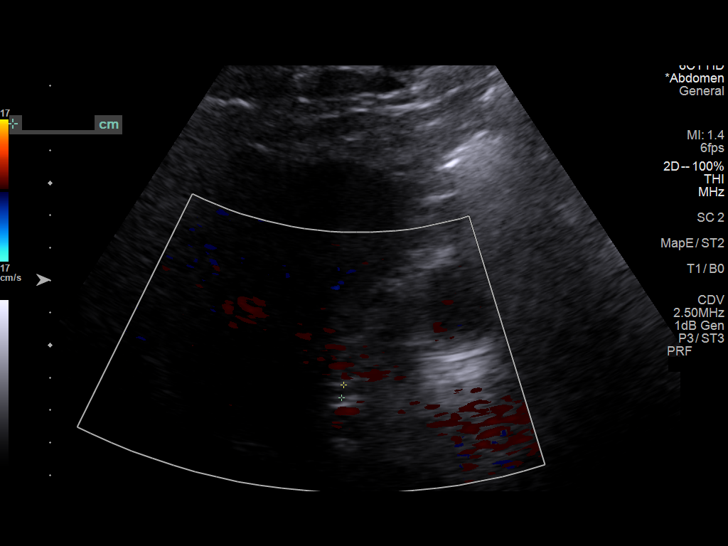
[im 17/46]
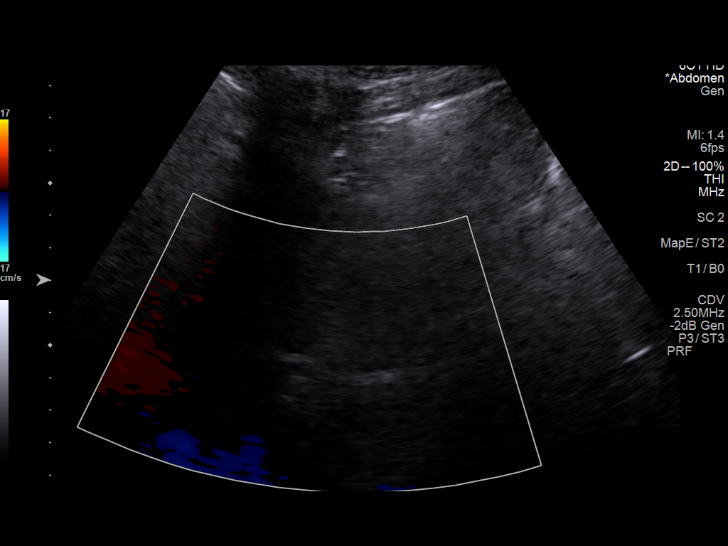
[im 21/46]
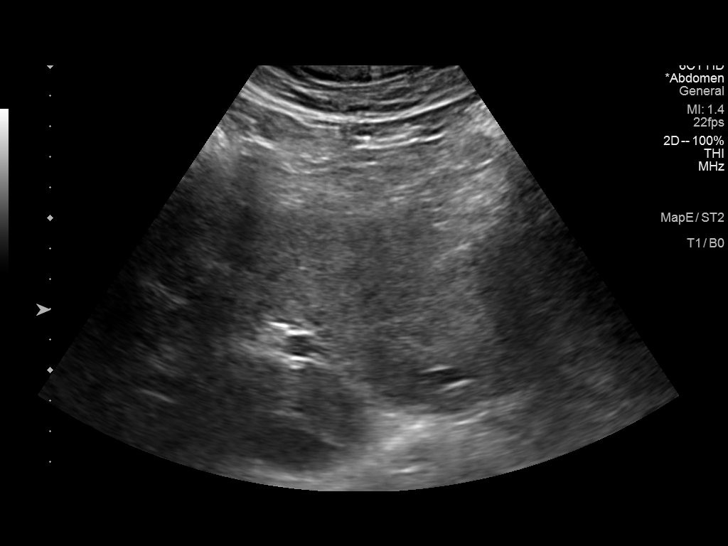
[im 25/46]
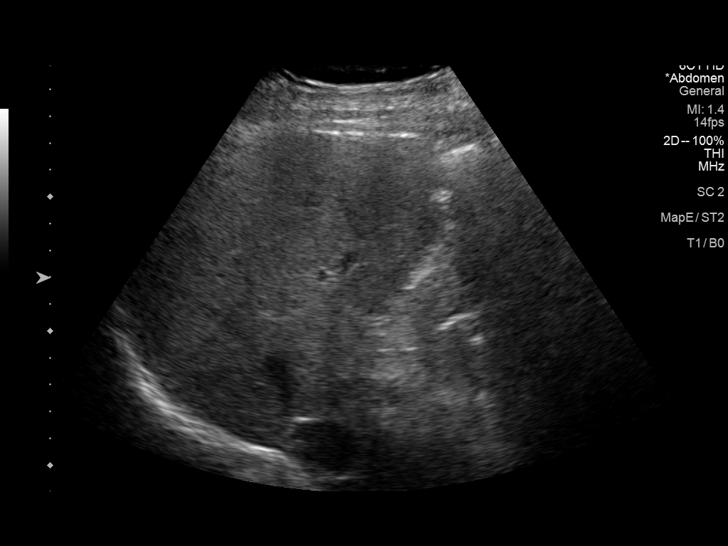
[im 29/46]
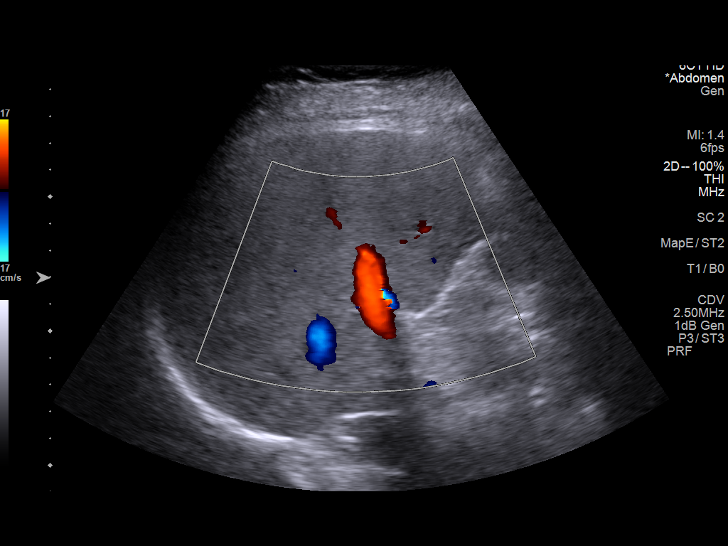
[im 31/46]
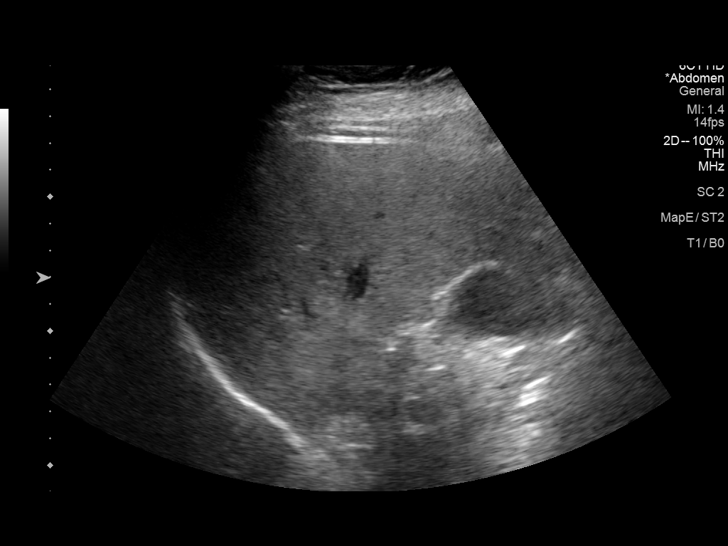
[im 34/46]
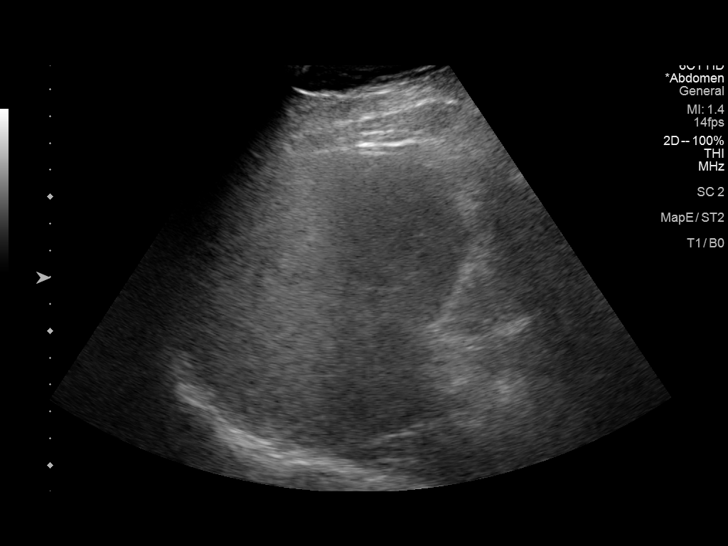
[im 38/46]
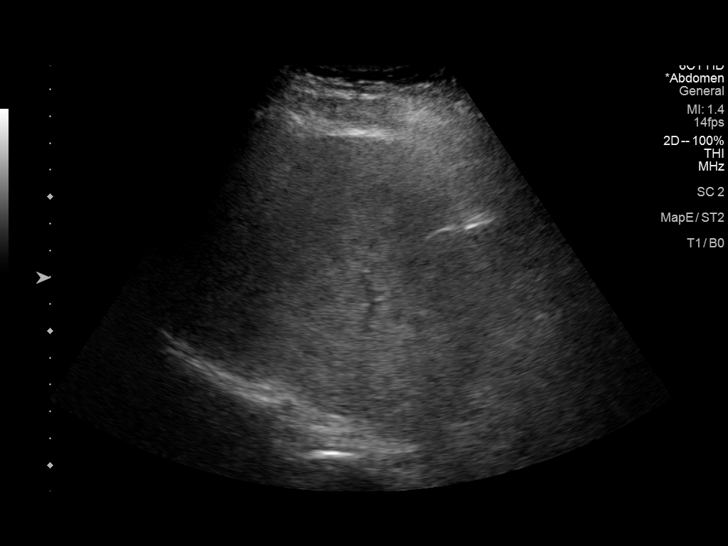
[im 42/46]
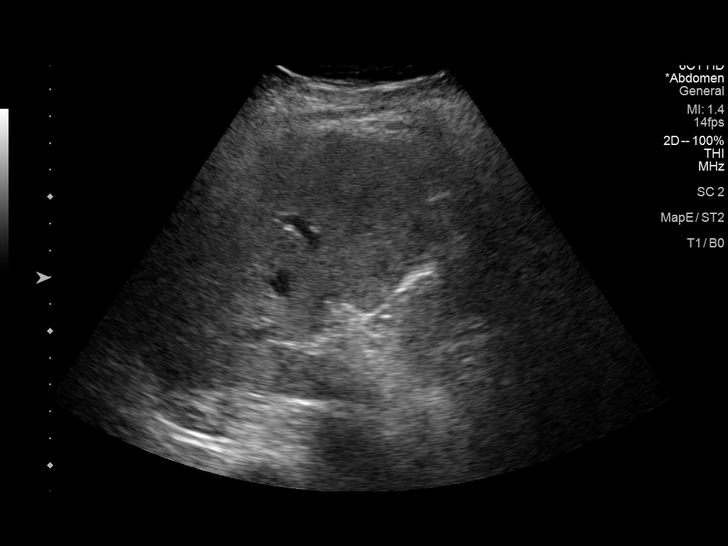
[im 46/46]
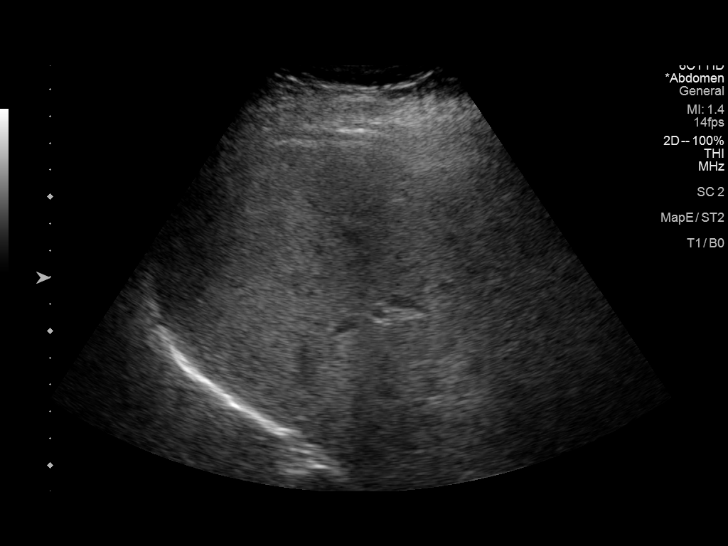

[14 of 25 positions shown; findings below may reference images not displayed]

FINDINGS: Gallbladder:

No gallstones or wall thickening visualized. No sonographic Murphy
sign noted by sonographer.

Common bile duct:

Diameter: 4 mm.

Liver:

There is coarse inhomogeneous increased echogenicity in the liver
without focal abnormalities. No significant interval changes are
noted. Portal vein is patent on color Doppler imaging with normal
direction of blood flow towards the liver.

Other: None.
IMPRESSION: There is coarse inhomogeneous increased echogenicity in the liver
suggesting fatty infiltration and possibly cirrhosis. No focal
abnormality is seen in the visualized portions of liver.

## 2022-09-12 ENCOUNTER — Other Ambulatory Visit (HOSPITAL_COMMUNITY): Payer: Self-pay | Admitting: Family Medicine

## 2022-09-12 DIAGNOSIS — R7989 Other specified abnormal findings of blood chemistry: Secondary | ICD-10-CM

## 2022-09-12 DIAGNOSIS — I10 Essential (primary) hypertension: Secondary | ICD-10-CM

## 2022-09-19 ENCOUNTER — Other Ambulatory Visit: Payer: Self-pay | Admitting: Gastroenterology

## 2022-09-19 ENCOUNTER — Ambulatory Visit: Payer: PPO | Admitting: Pulmonary Disease

## 2022-09-19 DIAGNOSIS — K7402 Hepatic fibrosis, advanced fibrosis: Secondary | ICD-10-CM

## 2022-09-26 ENCOUNTER — Ambulatory Visit (HOSPITAL_COMMUNITY)
Admission: RE | Admit: 2022-09-26 | Discharge: 2022-09-26 | Disposition: A | Discharge status: Home / Self Care | Payer: PPO | Source: Ambulatory Visit | Attending: Family Medicine | Admitting: Family Medicine

## 2022-09-26 DIAGNOSIS — R7989 Other specified abnormal findings of blood chemistry: Secondary | ICD-10-CM | POA: Diagnosis not present

## 2022-09-26 DIAGNOSIS — I10 Essential (primary) hypertension: Secondary | ICD-10-CM | POA: Insufficient documentation

## 2022-09-26 DIAGNOSIS — J449 Chronic obstructive pulmonary disease, unspecified: Secondary | ICD-10-CM | POA: Diagnosis not present

## 2022-09-26 DIAGNOSIS — I358 Other nonrheumatic aortic valve disorders: Secondary | ICD-10-CM | POA: Diagnosis not present

## 2022-09-26 LAB — ECHOCARDIOGRAM COMPLETE
Area-P 1/2: 1.55 cm2
Calc EF: 51.8 %
S' Lateral: 3.2 cm
Single Plane A2C EF: 49.4 %
Single Plane A4C EF: 53.1 %

## 2022-09-26 NOTE — Progress Notes (Signed)
Echocardiogram 2D Echocardiogram has been performed.  Benjamin [name] 09/26/2022, 2:52 PM

## 2022-10-17 ENCOUNTER — Other Ambulatory Visit: Payer: PPO

## 2022-10-25 ENCOUNTER — Ambulatory Visit
Admission: RE | Admit: 2022-10-25 | Discharge: 2022-10-25 | Disposition: A | Discharge status: Home / Self Care | Payer: PPO | Source: Ambulatory Visit | Attending: Gastroenterology | Admitting: Gastroenterology

## 2022-10-25 DIAGNOSIS — K7402 Hepatic fibrosis, advanced fibrosis: Secondary | ICD-10-CM

## 2022-10-31 ENCOUNTER — Ambulatory Visit: Payer: PPO | Admitting: Pulmonary Disease

## 2022-10-31 VITALS — BP 120/90 | HR 60 | Ht 70.0 in | Wt 194.8 lb

## 2022-10-31 DIAGNOSIS — Z87891 Personal history of nicotine dependence: Secondary | ICD-10-CM

## 2022-10-31 DIAGNOSIS — I7121 Aneurysm of the ascending aorta, without rupture: Secondary | ICD-10-CM | POA: Diagnosis not present

## 2022-10-31 DIAGNOSIS — J9611 Chronic respiratory failure with hypoxia: Secondary | ICD-10-CM | POA: Diagnosis not present

## 2022-10-31 DIAGNOSIS — J449 Chronic obstructive pulmonary disease, unspecified: Secondary | ICD-10-CM | POA: Diagnosis not present

## 2022-10-31 NOTE — Progress Notes (Signed)
Synopsis: Referred in May 2021 for COPD by Rankins, Fanny Dance, MD  Subjective:   PATIENT ID: Benjamin [name] GENDER: male DOB: Jan 24, 1951, MRN: [id number]  Chief Complaint  Patient presents with   Follow-up    Annual f/up    This is a 72 year old gentleman former tobacco abuse history quit smoking when he was 45 smoked from the age 81 for 30 years greater than a pack a day.  Diagnosis COPD in the past.  Has been managed for the past 10 years on Symbicort and as needed albuterol.  He tries to stay active however is limited due to his dyspnea at this point.  Additional medical history includes hypertension BPH.  Lives with his wife.  Recently had first grandchild.  Daughter is a travel Engineer, civil (consulting) in [place]. [place].  He recently traveled there to visit.  Also traveled to [facility] to visit son and new grandchild.  Both him and his wife have received their Covid vaccine.  Overall he has had progressive shortness of breath for the past several weeks.  He the pollen outside feels like it is more difficult for him to breathe.  He does have occasional wheezing.  He does not have a nebulizer at home.  OV 04/04/2020: Here today for COPD follow-up.  Patient had pulmonary function test completed in August with an FEV1 of 39% predicted, elevated TLC and RV ratio concerning for air trapping.  We reviewed patient's pulmonary function test today in the office.  He was switched at that time to Trelegy inhaler.  Has been doing okay with that.  He does feel like his albuterol nebulizer makes his heart race at times.  Otherwise has not had any hospitalizations or any exacerbations this past year after being placed on Trelegy.  He was able to go visit his daughter in [place].  Who works as a travel Engineer, civil (consulting).  OV 06/01/2021: Here today for follow-up.  Managed for his COPD currently using Trelegy daily.  He was involved in pulmonary rehab had desaturations.  He needs face-to-face evaluation today for home health to start oxygen  therapy.  He also has not had an ONO.  OV 10/31/2022: just went to [place] to visit family. He has been doing well. He got covid after he got back while traveling.  He is using his Trelegy daily.  Rarely using his albuterol.  CT scan of the chest completed recently with emphysema and a 4.3 cm thoracic aneurysm.    Past Medical History:  Diagnosis Date   BPH (benign prostatic hyperplasia)    COPD (chronic obstructive pulmonary disease) (HCC)    Diverticulosis    ED (erectile dysfunction)    External hemorrhoid    Hypertension    Internal hemorrhoid    Kidney stone    Personal history of colonic adenomas 05/15/2012     Family History  Problem Relation Age of Onset   Ovarian cancer Mother    Thyroid disease Mother    Leukemia Mother    Bone cancer Mother    Prostate cancer Father    Hypertension Father    Colon cancer Neg Hx      Past Surgical History:  Procedure Laterality Date   COLONOSCOPY W/ POLYPECTOMY     dental implants     upper   HEMORRHOID SURGERY     LEG SURGERY Right    fx- right repair plate screw    Social History   Socioeconomic History   Marital status: Married  Spouse name: Not on file   Number of children: Not on file   Years of education: Not on file   Highest education level: Not on file  Occupational History   Not on file  Tobacco Use   Smoking status: Former    Current packs/day: 0.00    Average packs/day: 2.0 packs/day for 30.0 years (60.0 ttl pk-yrs)    Types: Cigarettes    Start date: 50    Quit date: 1999    Years since quitting: 25.6   Smokeless tobacco: Never  Substance and Sexual Activity   Alcohol use: No    Alcohol/week: 0.0 standard drinks of alcohol   Drug use: Yes    Types: Marijuana   Sexual activity: Not on file  Other Topics Concern   Not on file  Social History Narrative   Not on file   Social Determinants of Health   Financial Resource Strain: Not on file  Food Insecurity: Not on file  Transportation  Needs: Not on file  Physical Activity: Not on file  Stress: Not on file  Social Connections: Not on file  Intimate Partner Violence: Not on file     Allergies  Allergen Reactions   Keflex [Cephalexin] Rash   Penicillins Rash     Outpatient Medications Prior to Visit  Medication Sig Dispense Refill   albuterol (PROVENTIL) (2.5 MG/3ML) 0.083% nebulizer solution Take 3 mLs (2.5 mg total) by nebulization every 4 (four) hours as needed for wheezing or shortness of breath. 75 mL 12   albuterol (VENTOLIN HFA) 108 (90 Base) MCG/ACT inhaler Inhale 2 puffs into the lungs every 6 (six) hours as needed for wheezing or shortness of breath. 8.5 g 5   alfuzosin (UROXATRAL) 10 MG 24 hr tablet Take 10 mg by mouth daily with breakfast.     Ascorbic Acid (VITAMIN C) 1000 MG tablet Take 1,000 mg by mouth daily.     aspirin 81 MG tablet Take 81 mg by mouth daily.     CIALIS 5 MG tablet Take 5 mg by mouth daily as needed for erectile dysfunction.     ibuprofen (ADVIL,MOTRIN) 600 MG tablet Take 1 tablet (600 mg total) by mouth every 8 (eight) hours as needed. 15 tablet 0   lisinopril (ZESTRIL) 40 MG tablet Take 40 mg by mouth daily.     Multiple Vitamin (MULTIVITAMIN) tablet Take 1 tablet by mouth daily.     TRELEGY ELLIPTA 100-62.5-25 MCG/ACT AEPB INHALE 1 PUFF INTO THE LUNGS DAILY 60 each 11   lisinopril (PRINIVIL,ZESTRIL) 20 MG tablet Take 20 mg by mouth daily.     No facility-administered medications prior to visit.    Review of Systems  Constitutional:  Negative for chills, fever, malaise/fatigue and weight loss.  HENT:  Negative for hearing loss, sore throat and tinnitus.   Eyes:  Negative for blurred vision and double vision.  Respiratory:  Positive for shortness of breath. Negative for cough, hemoptysis, sputum production, wheezing and stridor.   Cardiovascular:  Negative for chest pain, palpitations, orthopnea, leg swelling and PND.  Gastrointestinal:  Negative for abdominal pain, constipation,  diarrhea, heartburn, nausea and vomiting.  Genitourinary:  Negative for dysuria, hematuria and urgency.  Musculoskeletal:  Negative for joint pain and myalgias.  Skin:  Negative for itching and rash.  Neurological:  Negative for dizziness, tingling, weakness and headaches.  Endo/Heme/Allergies:  Negative for environmental allergies. Does not bruise/bleed easily.  Psychiatric/Behavioral:  Negative for depression. The patient is not nervous/anxious and does not have  insomnia.   All other systems reviewed and are negative.    Objective:  Physical Exam Vitals reviewed.  Constitutional:      General: He is not in acute distress.    Appearance: He is well-developed.  HENT:     Head: Normocephalic and atraumatic.  Eyes:     General: No scleral icterus.    Conjunctiva/sclera: Conjunctivae normal.     Pupils: Pupils are equal, round, and reactive to light.  Neck:     Vascular: No JVD.     Trachea: No tracheal deviation.  Cardiovascular:     Rate and Rhythm: Normal rate and regular rhythm.     Heart sounds: Normal heart sounds. No murmur heard. Pulmonary:     Effort: Pulmonary effort is normal. No tachypnea, accessory muscle usage or respiratory distress.     Breath sounds: No stridor. No wheezing, rhonchi or rales.  Abdominal:     General: There is no distension.     Palpations: Abdomen is soft.     Tenderness: There is no abdominal tenderness.  Musculoskeletal:        General: No tenderness.     Cervical back: Neck supple.  Lymphadenopathy:     Cervical: No cervical adenopathy.  Skin:    General: Skin is warm and dry.     Capillary Refill: Capillary refill takes less than 2 seconds.     Findings: No rash.  Neurological:     Mental Status: He is alert and oriented to person, place, and time.  Psychiatric:        Behavior: Behavior normal.      Vitals:   10/31/22 1527  BP: (!) 120/90  Pulse: 60  SpO2: 93%  Weight: 194 lb 12.8 oz (88.4 kg)  Height: 5\' 10"  (1.778 m)    93% on RA BMI Readings from Last 3 Encounters:  10/31/22 27.95 kg/m  06/27/21 29.77 kg/m  06/20/21 29.77 kg/m   Wt Readings from Last 3 Encounters:  10/31/22 194 lb 12.8 oz (88.4 kg)  06/27/21 207 lb 7.3 oz (94.1 kg)  06/20/21 207 lb 7.3 oz (94.1 kg)     CBC    Component Value Date/Time   WBC 9.0 10/07/2019 1640   RBC 4.54 10/07/2019 1640   HGB 14.6 10/07/2019 1640   HCT 43.6 10/07/2019 1640   PLT 187.0 10/07/2019 1640   MCV 96.0 10/07/2019 1640   MCH 30.6 12/24/2013 0951   MCHC 33.4 10/07/2019 1640   RDW 13.3 10/07/2019 1640   LYMPHSABS 2.2 10/07/2019 1640   MONOABS 0.6 10/07/2019 1640   EOSABS 0.0 10/07/2019 1640   BASOSABS 0.1 10/07/2019 1640    Chest Imaging: No recent imaging   Pulmonary Functions Testing Results:    Latest Ref Rng & Units 10/07/2019    2:46 PM  PFT Results  FVC-Pre L 2.42  P  FVC-Predicted Pre % 53  P  FVC-Post L 2.53  P  FVC-Predicted Post % 55  P  Pre FEV1/FVC % % 49  P  Post FEV1/FCV % % 53  P  FEV1-Pre L 1.19  P  FEV1-Predicted Pre % 35  P  FEV1-Post L 1.34  P  DLCO uncorrected ml/min/mmHg 17.24  P  DLCO UNC% % 65  P  DLCO corrected ml/min/mmHg 17.24  P  DLCO COR %Predicted % 65  P  DLVA Predicted % 99  P  TLC L 6.97  P  TLC % Predicted % 99  P  RV % Predicted % 182  P    P Preliminary result    FeNO: none   Pathology: none  Echocardiogram: none  Heart Catheterization: none     Assessment & Plan:     ICD-10-CM   1. Stage 3 severe COPD by GOLD classification (HCC)  J44.9     2. Chronic respiratory failure with hypoxia (HCC)  J96.11     3. Former smoker  Z87.891       Discussion: This is a 72 year old gentleman, COPD, chronic hypoxemic respiratory failure using 2 L nasal cannula oxygen currently on Trelegy.  Plan: Continue Trelegy daily Continue vitamin D supplementation Continue O2 supplementation to maintain sats above 88%. Return to clinic in 1 year or as needed for COPD management.   Current  Outpatient Medications:    albuterol (PROVENTIL) (2.5 MG/3ML) 0.083% nebulizer solution, Take 3 mLs (2.5 mg total) by nebulization every 4 (four) hours as needed for wheezing or shortness of breath., Disp: 75 mL, Rfl: 12   albuterol (VENTOLIN HFA) 108 (90 Base) MCG/ACT inhaler, Inhale 2 puffs into the lungs every 6 (six) hours as needed for wheezing or shortness of breath., Disp: 8.5 g, Rfl: 5   alfuzosin (UROXATRAL) 10 MG 24 hr tablet, Take 10 mg by mouth daily with breakfast., Disp: , Rfl:    Ascorbic Acid (VITAMIN C) 1000 MG tablet, Take 1,000 mg by mouth daily., Disp: , Rfl:    aspirin 81 MG tablet, Take 81 mg by mouth daily., Disp: , Rfl:    CIALIS 5 MG tablet, Take 5 mg by mouth daily as needed for erectile dysfunction., Disp: , Rfl:    ibuprofen (ADVIL,MOTRIN) 600 MG tablet, Take 1 tablet (600 mg total) by mouth every 8 (eight) hours as needed., Disp: 15 tablet, Rfl: 0   lisinopril (ZESTRIL) 40 MG tablet, Take 40 mg by mouth daily., Disp: , Rfl:    Multiple Vitamin (MULTIVITAMIN) tablet, Take 1 tablet by mouth daily., Disp: , Rfl:    TRELEGY ELLIPTA 100-62.5-25 MCG/ACT AEPB, INHALE 1 PUFF INTO THE LUNGS DAILY, Disp: 60 each, Rfl: 11   Josephine Igo, DO [facility] 10/31/2022 3:46 PM

## 2022-10-31 NOTE — Patient Instructions (Addendum)
Thank you for visiting Dr. Tonia Brooms at [facility]. Today we recommend the following:  TCTS referral for thoracic aneursym  Continue trelegy   Return in about 1 year (around 10/31/2023) for with APP.    Please do your part to reduce the spread of COVID-19.

## 2022-11-02 ENCOUNTER — Other Ambulatory Visit: Payer: Self-pay | Admitting: Emergency Medicine

## 2022-11-02 DIAGNOSIS — I7121 Aneurysm of the ascending aorta, without rupture: Secondary | ICD-10-CM

## 2022-11-07 ENCOUNTER — Ambulatory Visit: Payer: PPO | Attending: Cardiovascular Disease | Admitting: Cardiovascular Disease

## 2022-11-07 ENCOUNTER — Encounter: Payer: Self-pay | Admitting: Cardiovascular Disease

## 2022-11-07 VITALS — BP 116/84 | HR 66 | Ht 70.0 in | Wt 195.4 lb

## 2022-11-07 DIAGNOSIS — R931 Abnormal findings on diagnostic imaging of heart and coronary circulation: Secondary | ICD-10-CM

## 2022-11-07 DIAGNOSIS — I2510 Atherosclerotic heart disease of native coronary artery without angina pectoris: Secondary | ICD-10-CM

## 2022-11-07 DIAGNOSIS — I7121 Aneurysm of the ascending aorta, without rupture: Secondary | ICD-10-CM | POA: Diagnosis not present

## 2022-11-07 MED ORDER — ASPIRIN 81 MG PO TBEC: 81.0000 mg | DELAYED_RELEASE_TABLET | Freq: Every day | ORAL | Status: AC

## 2022-11-07 MED ORDER — ROSUVASTATIN CALCIUM 10 MG PO TABS: 10.0000 mg | ORAL_TABLET | Freq: Every day | ORAL | 3 refills | Status: DC

## 2022-11-07 NOTE — Patient Instructions (Signed)
Medication Instructions:  Your physician has recommended you make the following change in your medication:  1.) start rosuvastatin (Crestor) 10 mg - take one tablet daily 2.) start aspirin  81 mg - take one tablet daily  *If you need a refill on your cardiac medications before your next appointment, please call your pharmacy*   Lab Work: Please return in 12 weeks for blood work (lipids, liver function)  If you have labs (blood work) drawn today and your tests are completely normal, you will receive your results only by: MyChart Message (if you have MyChart) OR A paper copy in the mail If you have any lab test that is abnormal or we need to change your treatment, we will call you to review the results.   Testing/Procedures: none   Follow-Up: At [facility], you and your health needs are our priority.  As part of our continuing mission to provide you with exceptional heart care, we have created designated Provider Care Teams.  These Care Teams include your primary Cardiologist (physician) and Advanced Practice Providers (APPs -  Physician Assistants and Nurse Practitioners) who all work together to provide you with the care you need, when you need it.   Your next appointment:   6 month(s)  Provider:   Verne Carrow, MD

## 2022-11-07 NOTE — Progress Notes (Signed)
Chief Complaint  Patient presents with   New Patient (Initial Visit)    CAD   History of Present Illness: 72 yo male with history of prior alcohol abuse, dilated ascending aorta, cirrhosis, former tobacco abuse, COPD, HTN here today as a new consult, referred by Dr. Rubye Oaks, for the evaluation of abnormal coronary calcium score. Echo July 2024 with LVEF=45-50% with global hypokinesis. Aortic valve sclerosis. Mild dilation of the ascending aorta at 4.1 cm. Non-contrast CT chest for calcium scoring 09/26/22 with 4.3 cm ascending aorta. Calcium score 1026. He tells me today that he has no chest pain. He has baseline dyspnea due to his COPD. He no longer drinks alcohol.   Primary Care Physician: Orpha Bur, MD   Past Medical History:  Diagnosis Date   Alcohol abuse    Ascending aorta dilation (HCC)    BPH (benign prostatic hyperplasia)    Cirrhosis of liver (HCC)    COPD (chronic obstructive pulmonary disease) (HCC)    Diverticulosis    ED (erectile dysfunction)    External hemorrhoid    Heart failure (HCC)    Hepatitis C without hepatic coma    Hypertension    Internal hemorrhoid    Kidney stone    Male erectile dysfunction, unspecified    Personal history of colonic adenomas 05/15/2012   Personal history of colonic polyps     Past Surgical History:  Procedure Laterality Date   COLONOSCOPY W/ POLYPECTOMY     dental implants     upper   HEMORRHOID SURGERY     LEG SURGERY Right    fx- right repair plate screw    Current Outpatient Medications  Medication Sig Dispense Refill   albuterol (PROVENTIL) (2.5 MG/3ML) 0.083% nebulizer solution Take 3 mLs (2.5 mg total) by nebulization every 4 (four) hours as needed for wheezing or shortness of breath. 75 mL 12   albuterol (VENTOLIN HFA) 108 (90 Base) MCG/ACT inhaler Inhale 2 puffs into the lungs every 6 (six) hours as needed for wheezing or shortness of breath. 8.5 g 5   alfuzosin (UROXATRAL) 10 MG 24 hr tablet Take 10 mg by  mouth daily with breakfast.     Ascorbic Acid (VITAMIN C) 1000 MG tablet Take 1,000 mg by mouth daily.     aspirin EC 81 MG tablet Take 1 tablet (81 mg total) by mouth daily. Swallow whole.     CIALIS 5 MG tablet Take 5 mg by mouth daily as needed for erectile dysfunction.     ibuprofen (ADVIL,MOTRIN) 600 MG tablet Take 1 tablet (600 mg total) by mouth every 8 (eight) hours as needed. 15 tablet 0   lisinopril (ZESTRIL) 40 MG tablet Take 40 mg by mouth daily.     Multiple Vitamin (MULTIVITAMIN) tablet Take 1 tablet by mouth daily.     rosuvastatin (CRESTOR) 10 MG tablet Take 1 tablet (10 mg total) by mouth daily. 90 tablet 3   TRELEGY ELLIPTA 100-62.5-25 MCG/ACT AEPB INHALE 1 PUFF INTO THE LUNGS DAILY 60 each 11   No current facility-administered medications for this visit.    Allergies  Allergen Reactions   Keflex [Cephalexin] Rash   Penicillins Rash    Social History   Socioeconomic History   Marital status: Married    Spouse name: Not on file   Number of children: 2   Years of education: Not on file   Highest education level: Not on file  Occupational History   Occupation: Retired-electric Horticulturist, commercial  Tobacco  Use   Smoking status: Former    Current packs/day: 0.00    Average packs/day: 2.0 packs/day for 30.0 years (60.0 ttl pk-yrs)    Types: Cigarettes    Start date: 65    Quit date: 1999    Years since quitting: 25.6   Smokeless tobacco: Never  Substance and Sexual Activity   Alcohol use: No    Alcohol/week: 0.0 standard drinks of alcohol   Drug use: Yes    Types: Marijuana   Sexual activity: Not on file  Other Topics Concern   Not on file  Social History Narrative   Not on file   Social Determinants of Health   Financial Resource Strain: Not on file  Food Insecurity: Not on file  Transportation Needs: Not on file  Physical Activity: Not on file  Stress: Not on file  Social Connections: Not on file  Intimate Partner Violence: Not on file    Family  History  Problem Relation Age of Onset   Ovarian cancer Mother    Thyroid disease Mother    Leukemia Mother    Bone cancer Mother    Prostate cancer Father    Hypertension Father    Colon cancer Neg Hx     Review of Systems:  As stated in the HPI and otherwise negative.   BP 116/84   Pulse 66   Ht 5\' 10"  (1.778 m)   Wt 88.6 kg   SpO2 92%   BMI 28.04 kg/m   Physical Examination: General: Well developed, well nourished, NAD  HEENT: OP clear, mucus membranes moist  SKIN: warm, dry. No rashes. Neuro: No focal deficits  Musculoskeletal: Muscle strength 5/5 all ext  Psychiatric: Mood and affect normal  Neck: No JVD, no carotid bruits, no thyromegaly, no lymphadenopathy.  Lungs:Clear bilaterally, no wheezes, rhonci, crackles Cardiovascular: Regular rate and rhythm. No murmurs, gallops or rubs. Abdomen:Soft. Bowel sounds present. Non-tender.  Extremities: No lower extremity edema. Pulses are 2 + in the bilateral DP/PT.  EKG:  EKG is ordered today. The ekg ordered today demonstrates   Echo 09/26/22:  1. Left ventricular ejection fraction, by estimation, is 45 to 50%. Left  ventricular ejection fraction by 3D volume is 47 %. The left ventricle has  mildly decreased function. The left ventricle demonstrates global  hypokinesis. There is mild concentric  left ventricular hypertrophy. Left ventricular diastolic parameters are  consistent with Grade I diastolic dysfunction (impaired relaxation).   2. Right ventricular systolic function is normal. The right ventricular  size is normal.   3. The mitral valve is grossly normal. Trivial mitral valve  regurgitation. No evidence of mitral stenosis.   4. The aortic valve is tricuspid. There is mild calcification of the  aortic valve. There is mild thickening of the aortic valve. Aortic valve  regurgitation is not visualized. Aortic valve sclerosis/calcification is  present, without any evidence of  aortic stenosis.   5. Aortic  dilatation noted. There is borderline dilatation of the aortic  root, measuring 40 mm. There is mild dilatation of the ascending aorta,  measuring 41 mm.   6. The inferior vena cava is normal in size with greater than 50%  respiratory variability, suggesting right atrial pressure of 3 mmHg.   Recent Labs: No results found for requested labs within last 365 days.   Lipid Panel No results found for: "CHOL", "TRIG", "HDL", "CHOLHDL", "VLDL", "LDLCALC", "LDLDIRECT"   Wt Readings from Last 3 Encounters:  11/07/22 88.6 kg  10/31/22 88.4 kg  06/27/21 94.1 kg      Assessment and Plan:   1. CAD without angina: Abnormal coronary calcium score. Low normal LV function by echo July 2024 with global HK. No concerning symptoms. Start ASA 81 mg (4-5 days per week). Start Crestor 10 mg per day. Lipids and LFTs in 12 weeks.   2. Thoracic aortic aneurysm: Dilated ascending aorta at 4.3 cm by non-contrast CT. Chest CTA has been planned for next week by Dr. Tonia Brooms. Pending findings, will need this followed in 6 months or one year.   Labs/ tests ordered today include:  Orders Placed This Encounter  Procedures   Hepatic function panel   Lipid panel   EKG 12-Lead     Disposition:   F/U with me in 6 months    Signed, Verne Carrow, MD, [facility] 11/07/2022 4:03 PM    [facility] [address] [place], [place], [place]  [address] Phone: [phone]; Fax: [phone]

## 2022-11-12 ENCOUNTER — Ambulatory Visit (HOSPITAL_COMMUNITY)
Admission: RE | Admit: 2022-11-12 | Discharge: 2022-11-12 | Disposition: A | Discharge status: Home / Self Care | Payer: PPO | Source: Ambulatory Visit | Attending: Pulmonary Disease | Admitting: Pulmonary Disease

## 2022-11-12 DIAGNOSIS — I7121 Aneurysm of the ascending aorta, without rupture: Secondary | ICD-10-CM | POA: Insufficient documentation

## 2022-11-12 MED ORDER — IOHEXOL 350 MG/ML SOLN
75.0000 mL | Freq: Once | INTRAVENOUS | Status: AC | PRN
  Administered 2022-11-12: 75 mL via INTRAVENOUS

## 2022-11-15 ENCOUNTER — Institutional Professional Consult (permissible substitution): Payer: PPO

## 2022-11-15 ENCOUNTER — Encounter: Payer: Self-pay | Admitting: Physician Assistant

## 2022-11-15 VITALS — BP 122/78 | HR 67 | Resp 20 | Ht 70.0 in | Wt 195.0 lb

## 2022-11-15 DIAGNOSIS — I7121 Aneurysm of the ascending aorta, without rupture: Secondary | ICD-10-CM | POA: Diagnosis not present

## 2022-11-15 NOTE — Progress Notes (Signed)
PCP is Orpha Bur, MD Referring Provider is Josephine Igo, DO  Reason for Consult: Evaluation and surveillance of thoracic aortic aneurysm   HPI:  Benjamin [name] daughter is a nurse working for an emergency department in [place].  She joined [place] for this visit by phone.  Benjamin [name] is a 72 year old gentleman with past history notable for 30-pack-year smoking history having quit several years ago, COPD, dyslipidemia, prior alcohol abuse with cirrhosis, and dyslipidemia.  He recently underwent a coronary calcium scoring CT scan of his chest in July of this year with a calcium score of 1026 Angstrom units placing him in the 85th percentile for coronary disease.  He was incidentally noted to have a 4.3 cm thoracic aortic aneurysm.  He went on to have a CTA on 11/12/2022 confirming this result.  He was referred to cardiology and seen by Dr. Shirlee Latch.  Medical therapy for his asymptomatic coronary disease has been initiated. He was referred to [place] for evaluation and surveillance of the thoracic aortic aneurysm.    An echocardiogram confirms low normal left ventricular ejection fraction and no significant structural or functional valvular abnormalities. Benjamin [name] is retired.  He remains active and exercises with a trainer on a regular basis.  He denies having any chest pain and said he is most limited by his COPD.  He has no known family history of aneurysmal disease   Past Medical History:  Diagnosis Date   Alcohol abuse    Ascending aorta dilation (HCC)    BPH (benign prostatic hyperplasia)    Cirrhosis of liver (HCC)    COPD (chronic obstructive pulmonary disease) (HCC)    Diverticulosis    ED (erectile dysfunction)    External hemorrhoid    Heart failure (HCC)    Hepatitis C without hepatic coma    Hypertension    Internal hemorrhoid    Kidney stone    Male erectile dysfunction, unspecified    Personal history of colonic adenomas 05/15/2012   Personal history of colonic  polyps     Past Surgical History:  Procedure Laterality Date   COLONOSCOPY W/ POLYPECTOMY     dental implants     upper   HEMORRHOID SURGERY     LEG SURGERY Right    fx- right repair plate screw    Family History  Problem Relation Age of Onset   Ovarian cancer Mother    Thyroid disease Mother    Leukemia Mother    Bone cancer Mother    Prostate cancer Father    Hypertension Father    Colon cancer Neg Hx     Social History Social History   Tobacco Use   Smoking status: Former    Current packs/day: 0.00    Average packs/day: 2.0 packs/day for 30.0 years (60.0 ttl pk-yrs)    Types: Cigarettes    Start date: 42    Quit date: 1999    Years since quitting: 25.7   Smokeless tobacco: Never  Substance Use Topics   Alcohol use: No    Alcohol/week: 0.0 standard drinks of alcohol   Drug use: Yes    Types: Marijuana    Current Outpatient Medications  Medication Sig Dispense Refill   albuterol (PROVENTIL) (2.5 MG/3ML) 0.083% nebulizer solution Take 3 mLs (2.5 mg total) by nebulization every 4 (four) hours as needed for wheezing or shortness of breath. 75 mL 12   albuterol (VENTOLIN HFA) 108 (90 Base) MCG/ACT inhaler Inhale 2 puffs into the lungs  every 6 (six) hours as needed for wheezing or shortness of breath. 8.5 g 5   alfuzosin (UROXATRAL) 10 MG 24 hr tablet Take 10 mg by mouth daily with breakfast.     Ascorbic Acid (VITAMIN C) 1000 MG tablet Take 1,000 mg by mouth daily.     aspirin EC 81 MG tablet Take 1 tablet (81 mg total) by mouth daily. Swallow whole.     CIALIS 5 MG tablet Take 5 mg by mouth daily as needed for erectile dysfunction.     ibuprofen (ADVIL,MOTRIN) 600 MG tablet Take 1 tablet (600 mg total) by mouth every 8 (eight) hours as needed. 15 tablet 0   lisinopril (ZESTRIL) 40 MG tablet Take 40 mg by mouth daily.     Multiple Vitamin (MULTIVITAMIN) tablet Take 1 tablet by mouth daily.     rosuvastatin (CRESTOR) 10 MG tablet Take 1 tablet (10 mg total) by  mouth daily. 90 tablet 3   TRELEGY ELLIPTA 100-62.5-25 MCG/ACT AEPB INHALE 1 PUFF INTO THE LUNGS DAILY 60 each 11   No current facility-administered medications for this visit.    Allergies  Allergen Reactions   Keflex [Cephalexin] Rash   Penicillins Rash    Review of Systems: Review of Systems  Constitutional:  Positive for weight loss (He has recently lost weight by watching his diet and exercising).  HENT: Negative.    Eyes: Negative.   Respiratory:  Positive for shortness of breath.        History of COPD Former smoker  Cardiovascular: Negative.   Gastrointestinal: Negative.        Colonoscopy 11/22  Genitourinary: Negative.   Musculoskeletal: Negative.   Skin: Negative.   Neurological: Negative.   Endo/Heme/Allergies: Negative.   Psychiatric/Behavioral: Negative.      Physical Exam: Vital signs Blood pressure 122/78 Pulse 67 Respirations 20 SpO2 92% on room air  General: Pleasant 72 year old male who appears his stated age.  Difficulty with a steady gait.  No distress. HEENT: Unremarkable Neck: Supple, no carotid bruits or JVD Chest: Symmetrical, breath sounds are shallow, few wheezes. Heart: Regular rate and rhythm, no murmur Extremities are well-perfused, no obvious deformity, no peripheral edema  Diagnostic Tests: ADDENDUM REPORT: 10/04/2022 11:12   EXAM: OVER-READ INTERPRETATION  CT CHEST   The following report is an over-read performed by radiologist Dr. Narda Rutherford of [facility] on 10/04/2022. This over-read does not include interpretation of cardiac or coronary anatomy or pathology. The coronary calcium score interpretation by the cardiologist is attached.   COMPARISON:  Chest radiograph 08/11/2019.  CT 02/02/2004   FINDINGS: Vascular: Aortic atherosclerosis. The ascending aorta is dilated at 4.3 cm. Descending aorta is tortuous.   Mediastinum/nodes: No adenopathy or mass.  Small hiatal hernia.   Lungs: Advanced bullous  emphysema. Large bulla in the right upper lobe causes mild mass effect of the adjacent lung parenchyma. Linear atelectasis or scarring in the lower lobes. No pulmonary mass or suspicious nodule.   Upper abdomen: No acute findings.   Musculoskeletal: There are no acute or suspicious osseous abnormalities.   IMPRESSION: 1. Small hiatal hernia. 2. Advanced bullous emphysema. 3. Dilated ascending aorta at 4.3 cm. Recommend annual imaging followup by CTA or MRA. This recommendation follows 2010 ACCF/AHA/AATS/ACR/ASA/SCA/SCAI/SIR/STS/SVM Guidelines for the Diagnosis and Management of Patients with Thoracic Aortic Disease. Circulation. 2010; 121: X528-U132. Aortic aneurysm NOS (ICD10-I71.9)   Aortic Atherosclerosis (ICD10-I70.0) and Emphysema (ICD10-J43.9).     Electronically Signed   By: Narda Rutherford M.D.   On:  10/04/2022 11:12    Addended by Courtney Heys, MD on 10/04/2022 11:15 AM    Study Result  Narrative & Impression  CLINICAL DATA:  Cardiovascular Disease Risk stratification   EXAM: Coronary Calcium Score   TECHNIQUE: A gated, non-contrast computed tomography scan of the heart was performed using 3mm slice thickness. Axial images were analyzed on a dedicated workstation. Calcium scoring of the coronary arteries was performed using the Agatston method.   MEDICATIONS: MEDICATIONS None   FINDINGS: Coronary arteries: Normal origins.   Coronary Calcium Score:   Left main: 7.12   Left anterior descending artery: 613   Left circumflex artery: 205   Right coronary artery: 201   Total: 1026   Percentile: 85th   Pericardium: Normal.   Ascending Aorta: Dilated to 43 mm.   Non-cardiac: See separate report from [facility].   IMPRESSION: Coronary calcium score of 1026 Agatston units. This was 85th percentile for age-, race-, and sex-matched controls.   Dilated ascending aorta to 4.3 cm. Consider formal CTA chest or MRA chest to fully  evaluate.   RECOMMENDATIONS: Coronary artery calcium (CAC) score is a strong predictor of incident coronary heart disease (CHD) and provides predictive information beyond traditional risk factors. CAC scoring is reasonable to use in the decision to withhold, postpone, or initiate statin therapy in intermediate-risk or selected borderline-risk asymptomatic adults (age 39-75 years and LDL-C >=70 to <190 mg/dL) who do not have diabetes or established atherosclerotic cardiovascular disease (ASCVD).* In intermediate-risk (10-year ASCVD risk >=7.5% to <20%) adults or selected borderline-risk (10-year ASCVD risk >=5% to <7.5%) adults in whom a CAC score is measured for the purpose of making a treatment decision the following recommendations have been made:   If CAC=0, it is reasonable to withhold statin therapy and reassess in 5 to 10 years, as long as higher risk conditions are absent (diabetes mellitus, family history of premature CHD in first degree relatives (males <55 years; females <65 years), cigarette smoking, or LDL >=190 mg/dL).   If CAC is 1 to 99, it is reasonable to initiate statin therapy for patients >=69 years of age.   If CAC is >=100 or >=75th percentile, it is reasonable to initiate statin therapy at any age.   Cardiology referral should be considered for patients with CAC scores >=400 or >=75th percentile.   *2018 AHA/ACC/AACVPR/AAPA/ABC/ACPM/ADA/AGS/APhA/ASPC/NLA/PCNA Guideline on the Management of Blood Cholesterol: A Report of the American College of Cardiology/American Heart Association Task Force on Clinical Practice Guidelines. J Am Coll Cardiol. 2019;73(24):3168-3209.   Marca Ancona, MD   Electronically Signed: By: Marca Ancona M.D. On: 09/26/2022 16:46    ECHOCARDIOGRAM REPORT       Patient Name:   Benjamin [name] Date of Exam: 09/26/2022  Medical Rec #:  [id number]      Height:       70.0 in  Accession #:    [id number]     Weight:       207.5 lb   Date of Birth:  1951-01-17      BSA:          2.120 m  Patient Age:    72 years       BP:           120/64 mmHg  Patient Gender: M              HR:           74 bpm.  Exam Location:  Outpatient   Procedure: 2D  Echo, 3D Echo, Color Doppler, Cardiac Doppler and Strain  Analysis   Indications:   Elevated brain natriuretic peptide (BNP) level [[id number]]    History:        Patient has no prior history of Echocardiogram  examinations.                 COPD; Risk Factors:Hypertension.    Sonographer:    Eulah Pont RDCS  Referring Phys: [id number] Grayling Congress MADDEN     Sonographer Comments: Global longitudinal strain was attempted.  IMPRESSIONS     1. Left ventricular ejection fraction, by estimation, is 45 to 50%. Left  ventricular ejection fraction by 3D volume is 47 %. The left ventricle has  mildly decreased function. The left ventricle demonstrates global  hypokinesis. There is mild concentric  left ventricular hypertrophy. Left ventricular diastolic parameters are  consistent with Grade I diastolic dysfunction (impaired relaxation).   2. Right ventricular systolic function is normal. The right ventricular  size is normal.   3. The mitral valve is grossly normal. Trivial mitral valve  regurgitation. No evidence of mitral stenosis.   4. The aortic valve is tricuspid. There is mild calcification of the  aortic valve. There is mild thickening of the aortic valve. Aortic valve  regurgitation is not visualized. Aortic valve sclerosis/calcification is  present, without any evidence of  aortic stenosis.   5. Aortic dilatation noted. There is borderline dilatation of the aortic  root, measuring 40 mm. There is mild dilatation of the ascending aorta,  measuring 41 mm.   6. The inferior vena cava is normal in size with greater than 50%  respiratory variability, suggesting right atrial pressure of 3 mmHg.   Comparison(s): No prior Echocardiogram.   FINDINGS   Left Ventricle: Left  ventricular ejection fraction, by estimation, is 45  to 50%. Left ventricular ejection fraction by 3D volume is 47 %. The left  ventricle has mildly decreased function. The left ventricle demonstrates  global hypokinesis. Global  longitudinal strain performed but not reported based on interpreter  judgement due to suboptimal tracking. The left ventricular internal cavity  size was normal in size. There is mild concentric left ventricular  hypertrophy. Left ventricular diastolic  parameters are consistent with Grade I diastolic dysfunction (impaired  relaxation).   Right Ventricle: The right ventricular size is normal. No increase in  right ventricular wall thickness. Right ventricular systolic function is  normal.   Left Atrium: Left atrial size was normal in size.   Right Atrium: Right atrial size was normal in size.   Pericardium: There is no evidence of pericardial effusion.   Mitral Valve: The mitral valve is grossly normal. Trivial mitral valve  regurgitation. No evidence of mitral valve stenosis.   Tricuspid Valve: The tricuspid valve is normal in structure. Tricuspid  valve regurgitation is not demonstrated.   Aortic Valve: The aortic valve is tricuspid. There is mild calcification  of the aortic valve. There is mild thickening of the aortic valve. Aortic  valve regurgitation is not visualized. Aortic valve  sclerosis/calcification is present, without any  evidence of aortic stenosis.   Pulmonic Valve: The pulmonic valve was normal in structure. Pulmonic valve  regurgitation is trivial.   Aorta: Aortic dilatation noted. There is borderline dilatation of the  aortic root, measuring 40 mm. There is mild dilatation of the ascending  aorta, measuring 41 mm.   Venous: The inferior vena cava is normal in size with greater than 50%  respiratory variability, suggesting right  atrial pressure of 3 mmHg.   IAS/Shunts: The atrial septum is grossly normal.     LEFT VENTRICLE   PLAX 2D  LVIDd:         4.50 cm         Diastology  LVIDs:         3.20 cm         LV e' medial:    3.62 cm/s  LV PW:         1.10 cm         LV E/e' medial:  10.0  LV IVS:        1.30 cm         LV e' lateral:   4.12 cm/s  LVOT diam:     2.20 cm         LV E/e' lateral: 8.8  LV SV:         60  LV SV Index:   28  LVOT Area:     3.80 cm        3D Volume EF                                 LV 3D EF:    Left                                              ventricul  LV Volumes (MOD)                            ar  LV vol d, MOD    114.0 ml                   ejection  A2C:                                        fraction  LV vol d, MOD    105.0 ml                   by 3D  A4C:                                        volume is  LV vol s, MOD    57.7 ml                    47 %.  A2C:  LV vol s, MOD    49.2 ml  A4C:                           3D Volume EF:  LV SV MOD A2C:   56.3 ml       3D EF:        47 %  LV SV MOD A4C:   105.0 ml      LV EDV:       160 ml  LV SV MOD BP:    57.2 ml       LV ESV:       85 ml  LV SV:        75 ml   RIGHT VENTRICLE  RV S prime:     11.70 cm/s  TAPSE (M-mode): 1.8 cm   LEFT ATRIUM             Index        RIGHT ATRIUM           Index  LA diam:        4.40 cm 2.08 cm/m   RA Area:     17.60 cm  LA Vol (A2C):   62.7 ml 29.58 ml/m  RA Volume:   50.60 ml  23.87 ml/m  LA Vol (A4C):   55.5 ml 26.18 ml/m  LA Biplane Vol: 60.9 ml 28.73 ml/m   AORTIC VALVE             PULMONIC VALVE  LVOT Vmax:   72.10 cm/s  PR End Diast Vel: 10.24 msec  LVOT Vmean:  48.200 cm/s  LVOT VTI:    0.157 m    AORTA  Ao Root diam: 4.10 cm  Ao Asc diam:  4.10 cm   MITRAL VALVE  MV Area (PHT): 1.55 cm    SHUNTS  MV Decel Time: 489 msec    Systemic VTI:  0.16 m  MV E velocity: 36.10 cm/s  Systemic Diam: 2.20 cm  MV A velocity: 75.10 cm/s  MV E/A ratio:  0.48   Laurance Flatten MD  Electronically signed by Laurance Flatten MD  Signature  Date/Time: 09/26/2022/3:19:54 PM         Impression / Plan: Initial evaluation for recently identified 4.3 cm thoracic aortic aneurysm.  Benjamin [name] is asymptomatic and has no family history of aneurysmal disease. We discussed the recommendations for ongoing surveillance and the importance of careful management of hypertension and dyslipidemia.  We discussed the recommendation for avoiding strenuous activity and avoiding the fluoroquinolone class of antibiotics.   Plan for follow-up CTA in 1 year.   Leary Roca, PA-C [facility] [phone]

## 2022-11-15 NOTE — Patient Instructions (Signed)
Continue to maintain strict control of his blood pressure with a goal of less than 130/90  Continue rosuvastatin  Avoid strenuous activity with no lifting or straining against greater than 50% of body weight.  Avoid the fluoroquinolone class of antibiotics such as Cipro and Levaquin.   Follow-up in 1 year with CTA chest

## 2022-11-20 NOTE — Progress Notes (Signed)
Ct results discussed at surgical consult visit. Will need 12 month follow up ct which can be done on his next annual angio to follow the left lung 5mm nodule   Thanks,  BLI  Josephine Igo, DO [facility] 11/20/2022 4:57 PM

## 2022-12-31 ENCOUNTER — Ambulatory Visit: Payer: PPO | Admitting: Cardiovascular Disease

## 2023-01-25 ENCOUNTER — Telehealth: Payer: Self-pay | Admitting: Pulmonary Disease

## 2023-01-25 MED ORDER — ALBUTEROL SULFATE HFA 108 (90 BASE) MCG/ACT IN AERS: 2.0000 | INHALATION_SPRAY | Freq: Four times a day (QID) | RESPIRATORY_TRACT | 5 refills | Status: DC | PRN

## 2023-01-25 NOTE — Telephone Encounter (Signed)
Called and spoke with patient, he needs a refill of his albuterol inhaler.  He did find a full one, but it is his last one.  Advised I would send in refills to his pharmacy.  He verbalized understanding.  Nothing further needed.

## 2023-01-25 NOTE — Telephone Encounter (Signed)
PT needs Albuterol inhaler.  Pharm is [facility] on [address] needs today.  His # is [phone]

## 2023-02-08 ENCOUNTER — Ambulatory Visit: Payer: PPO | Attending: Cardiovascular Disease

## 2023-02-08 DIAGNOSIS — I2510 Atherosclerotic heart disease of native coronary artery without angina pectoris: Secondary | ICD-10-CM

## 2023-04-10 ENCOUNTER — Other Ambulatory Visit: Payer: Self-pay | Admitting: Gastroenterology

## 2023-04-10 DIAGNOSIS — K7460 Unspecified cirrhosis of liver: Secondary | ICD-10-CM

## 2023-05-10 ENCOUNTER — Ambulatory Visit
Admission: RE | Admit: 2023-05-10 | Discharge: 2023-05-10 | Disposition: A | Discharge status: Home / Self Care | Source: Ambulatory Visit | Attending: Gastroenterology | Admitting: Gastroenterology

## 2023-05-10 DIAGNOSIS — K7460 Unspecified cirrhosis of liver: Secondary | ICD-10-CM

## 2023-06-26 ENCOUNTER — Other Ambulatory Visit (HOSPITAL_BASED_OUTPATIENT_CLINIC_OR_DEPARTMENT_OTHER): Payer: Self-pay | Admitting: Pulmonary Disease

## 2023-06-26 NOTE — Telephone Encounter (Signed)
 Copied from CRM [id number]. Topic: Clinical - Medication Refill >> Jun 26, 2023 10:03 AM Isabell A wrote: Most Recent Primary Care Visit:   Medication: TRELEGY ELLIPTA  100-62.5-25 MCG/ACT AEPB  Has the patient contacted their pharmacy? Yes (Agent: If no, request that the patient contact the pharmacy for the refill. If patient does not wish to contact the pharmacy document the reason why and proceed with request.) (Agent: If yes, when and what did the pharmacy advise?)  Is this the correct pharmacy for this prescription? Yes If no, delete pharmacy and type the correct one.  This is the patient's preferred pharmacy:  [facility][id number] Jonette Nestle, [place] - [address] AT [facility] [address] [facility] [address] [place] [address] Phone: [phone] Fax: [phone]   Has the prescription been filled recently? Yes  Is the patient out of the medication? Yes  Has the patient been seen for an appointment in the last year OR does the patient have an upcoming appointment? Yes  Can we respond through MyChart? No  Agent: Please be advised that Rx refills may take up to 3 business days. We ask that you follow-up with your pharmacy.

## 2023-06-28 ENCOUNTER — Other Ambulatory Visit: Payer: Self-pay | Admitting: Pulmonary Disease

## 2023-06-28 ENCOUNTER — Telehealth: Payer: Self-pay

## 2023-06-28 ENCOUNTER — Ambulatory Visit: Payer: Self-pay

## 2023-06-28 DIAGNOSIS — J449 Chronic obstructive pulmonary disease, unspecified: Secondary | ICD-10-CM

## 2023-06-28 MED ORDER — TRELEGY ELLIPTA 100-62.5-25 MCG/ACT IN AEPB: 1.0000 | INHALATION_SPRAY | Freq: Every day | RESPIRATORY_TRACT | 2 refills | Status: DC

## 2023-06-28 NOTE — Telephone Encounter (Signed)
 Copied from CRM [id number]. Topic: Clinical - Red Word Triage >> Jun 28, 2023  2:36 PM Hilton Lucky wrote: Red Word that prompted transfer to Nurse Triage: trouble breathing, out of trellegy  Agent reporting that pt has been out of trelegy for 4 days, was with Dr. Thelda Finney, pt has talked to this agent 2-3x today and other agent 2x today, this is likely 6th call.  TRIAGE SUMMARY NOTE: Agent reporting that pt has called and spoken to multiple agents several times each today, requesting refill for trelegy. Pt was pt of Dr. Glenis Langdon, has not yet established care with another pulm provider to this point. Speaking to pt, pt reporting that he has been feeling SOB more than usual but "not feeling worse yet if I could get my prescription," confirms likely feeling more SOB since ran out of trelegy 4 days ago. Pt speaking in full sentences. Pt reporting that his cardiologist does not like the inhalers pt has been prescribed, so tries to avoid using albuterol  inhaler, "don't use it unless desperate," confirms that he used albuterol  inhaler this morning to help open airway and it helped "somewhat." Pt confirms not struggling to breathe, no chest pain, dizziness, or weakness. Advised that pt get examined today for symptoms, no pulm availability today, advised call PCP or go to UC to get checked out, can check with PCP or UC about getting trelegy as well, advised utilize inhaler as needed, call back if worsening. Advised that refills for pulm office can take 5 days if established with provider, advised pt get established with another pulm doc, pt states he will call back Monday to schedule appt with new pulm doc then hung up.  E2C2 Pulmonary Triage - Initial Assessment Questions "Chief Complaint (e.g., cough, sob, wheezing, fever, chills, sweat or additional symptoms) *Go to specific symptom protocol after initial questions. Out of Trelegy Trouble breathing, made 6 calls trying to get someone to call in new prescription No chest  pain, dizziness, weakness  "How long have symptoms been present?" Not feeling worse yet if could get prescription, since out of trelegy  "Have you used your inhalers/maintenance medication?" Yes If yes, "What medications?" Heart doc said didn't like the meds, don't use it unless desperate, used this morning to help somewhat  Reason for Disposition  [1] Longstanding difficulty breathing (e.g., CHF, COPD, emphysema) AND [2] WORSE than normal  Answer Assessment - Initial Assessment Questions 6. CARDIAC HISTORY: "Do you have any history of heart disease?" (e.g., heart attack, angina, bypass surgery, angioplasty)      HTN 7. LUNG HISTORY: "Do you have any history of lung disease?"  (e.g., pulmonary embolus, asthma, emphysema)     significant 8. CAUSE: "What do you think is causing the breathing problem?"      Out of med  Protocols used: Breathing Difficulty-A-AH

## 2023-06-28 NOTE — Telephone Encounter (Signed)
 Copied from CRM [id number]. Topic: Clinical - Medication Refill >> Jun 28, 2023 12:51 PM Juliaette Ober wrote: Most Recent Primary Care Visit: 10/31/22; Dr. Thelda Finney  Medication: TRELEGY ELLIPTA  100-62.5-25 MCG/ACT AEPB  Has the patient contacted their pharmacy? Yes (Agent: If no, request that the patient contact the pharmacy for the refill. If patient does not wish to contact the pharmacy document the reason why and proceed with request.) (Agent: If yes, when and what did the pharmacy advise?)  Is this the correct pharmacy for this prescription? Yes If no, delete pharmacy and type the correct one.  This is the patient's preferred pharmacy:  [facility] #[id number] Jonette Nestle, [place] - [address] AT [facility] [address] & [facility] [address] [place] [address] Phone: [phone] Fax: [phone]   Has the prescription been filled recently? No  Is the patient out of the medication? Yes  Has the patient been seen for an appointment in the last year OR does the patient have an upcoming appointment? Yes  Can we respond through MyChart? Yes  Agent: Please be advised that Rx refills may take up to 3 business days. We ask that you follow-up with your pharmacy.

## 2023-06-28 NOTE — Telephone Encounter (Signed)
 Trelegy refill sent to pharmacy.  Patient needs to be scheduled for follow up visit.  JD

## 2023-07-01 ENCOUNTER — Telehealth: Payer: Self-pay

## 2023-07-01 NOTE — Telephone Encounter (Signed)
 Spoke with patient regarding prior message . Made patient a f/u with Beth . Advised patient I will leave a sample upfront for patient to pick up . Patient stated [facility] pharmacy is still working on patient's refill .   Patient's voice was understanding.Nothing else further needed.

## 2023-07-01 NOTE — Telephone Encounter (Signed)
 Pt was sent refills to pharmacy but need to make an appointment to establish a provider

## 2023-07-01 NOTE — Telephone Encounter (Signed)
 Copied from CRM [id number]. Topic: Clinical - Medication Refill >> Jun 28, 2023 12:58 PM Hilton Lucky wrote: Patient is calling in once again to demand that this be handled ASAP. Patient is rude and unsatisfied with any answers provided to questions asked. Informed request is currently waiting with a provider and nobody but the provider will be able to send his medicine or make decisions about his care at this time. Patient states will continue to call until this has been addressed.

## 2023-07-01 NOTE — Telephone Encounter (Signed)
 He has a follow up with Sueanne Emerald on 5/12

## 2023-07-01 NOTE — Telephone Encounter (Signed)
 Copied from CRM [id number]. Topic: General - Other >> Jun 28, 2023 12:46 PM Benjamin [name] wrote: Reason for CRM: patient calling in to get a refill on his medication trelogy, he states that dr. Thelda Finney is the one that filled his prescription, I advised him that I would send a message over to the clinical staff to request the trelogy, I also advised that a clinical staff will call him by the end of the day or a prescription will be sent to his preferred pharmacy, patient is unsatisfied with answer and want a answer immediately. Also advised patient that he may need to schedule an appointment with a different doctor because he havebt seen any other doctor besides dr. Thelda Finney.   Left message for PT to call back needs an appointment w/ provider.. we can send in a courtesy refill but he needs to make appointment

## 2023-07-02 NOTE — Telephone Encounter (Signed)
 I sent in refill on 06/28/23 per other phone note.  JD

## 2023-07-09 ENCOUNTER — Telehealth: Payer: Self-pay | Admitting: Primary Care

## 2023-07-09 NOTE — Telephone Encounter (Signed)
 CMN recived from [employer] [facility] for DME oxygen  and supplies.

## 2023-07-16 ENCOUNTER — Encounter: Payer: Self-pay | Admitting: Primary Care

## 2023-07-16 ENCOUNTER — Ambulatory Visit: Admitting: Primary Care

## 2023-07-16 VITALS — BP 128/72 | HR 91 | Temp 97.8°F | Ht 70.0 in | Wt 190.0 lb

## 2023-07-16 DIAGNOSIS — I719 Aortic aneurysm of unspecified site, without rupture: Secondary | ICD-10-CM

## 2023-07-16 DIAGNOSIS — Z87891 Personal history of nicotine dependence: Secondary | ICD-10-CM

## 2023-07-16 DIAGNOSIS — R911 Solitary pulmonary nodule: Secondary | ICD-10-CM

## 2023-07-16 DIAGNOSIS — J449 Chronic obstructive pulmonary disease, unspecified: Secondary | ICD-10-CM

## 2023-07-16 MED ORDER — TRELEGY ELLIPTA 100-62.5-25 MCG/ACT IN AEPB: 1.0000 | INHALATION_SPRAY | Freq: Every day | RESPIRATORY_TRACT | 11 refills | Status: AC

## 2023-07-16 MED ORDER — ALBUTEROL SULFATE HFA 108 (90 BASE) MCG/ACT IN AERS: 2.0000 | INHALATION_SPRAY | Freq: Four times a day (QID) | RESPIRATORY_TRACT | 5 refills | Status: AC | PRN

## 2023-07-16 NOTE — Patient Instructions (Signed)
-  SEVERE COPD WITH ASTHMA FEATURES: Severe COPD with asthma features means you have chronic obstructive pulmonary disease with some characteristics of asthma, such as improvement in lung function after using a rescue inhaler. Continue using your Trelegy inhaler daily and albuterol  as needed. Monitor your oxygen  saturation during exercise. If you notice increased wheezing or need to use albuterol  more frequently, we may need to adjust your Trelegy dose.  -EMPHYSEMA: Emphysema is a condition where the air sacs in your lungs are damaged, which affects oxygen  exchange. This is likely due to your history of smoking. Continue your regular exercise and consider pulmonary rehabilitation. Maintain a healthy weight and avoid smoking to help manage your symptoms. Use supplemental oxygen  to maintain O2 >88%.   -LUNG NODULE MONITORING: A lung nodule is a small growth in the lung. We found a new 5 mm nodule in your left lung last year, and it needs to be monitored for any changes. We will do a follow-up CT scan in September 2025 to check on it.  INSTRUCTIONS: Please schedule a follow-up CT scan for September 2025 to monitor the lung nodule. Continue using your Trelegy inhaler daily and albuterol  as needed. Monitor your oxygen  saturation during exercise and let us  know if you experience increased wheezing or need to use albuterol  more frequently. Keep up with your regular exercise routine, maintain a healthy weight, and avoid smoking.  Orders: CT chest in September 2025 Walk test   RX: Trelegy and Albuterol    Follow-up: 6 months with Dr. Byrum or sooner if needed (30 min visit- former Dr. Thelda Finney)

## 2023-07-16 NOTE — Progress Notes (Signed)
 @Patient  ID: Benjamin [name], male    DOB: 02/09/1951, 73 y.o.   MRN: [id number]  No chief complaint on file.   Referring provider: Arlys Berke, MD  HPI: 73 year old male, former smoker quit 1999.  Past medical history significant for hypertension, COPD, hyperlipidemia.  Former patient of Dr. Thelda Finney.  Diagnosis COPD in the past.  Has been managed for the past 10 years on Symbicort and as needed albuterol .  He tries to stay active however is limited due to his dyspnea at this point.  Additional medical history includes hypertension BPH.  Lives with his wife.  Recently had first grandchild.  Daughter is a travel Engineer, civil (consulting) in [place]. [place].  He recently traveled there to visit.  Also traveled to [place]  to visit son and new grandchild.  Both him and his wife have received their Covid vaccine.  Overall he has had progressive shortness of breath for the past several weeks.  He the pollen outside feels like it is more difficult for him to breathe.  He does have occasional wheezing.  He does not have a nebulizer at home.  OV 04/04/2020: Here today for COPD follow-up.  Patient had pulmonary function test completed in August with an FEV1 of 39% predicted, elevated TLC and RV ratio concerning for air trapping.  We reviewed patient's pulmonary function test today in the office.  He was switched at that time to Trelegy inhaler.  Has been doing okay with that.  He does feel like his albuterol  nebulizer makes his heart race at times.  Otherwise has not had any hospitalizations or any exacerbations this past year after being placed on Trelegy.  He was able to go visit his daughter in [place].  Who works as a travel Engineer, civil (consulting).  OV 06/01/2021: Here today for follow-up.  Managed for his COPD currently using Trelegy daily.  He was involved in pulmonary rehab had desaturations.  He needs face-to-face evaluation today for home health to start oxygen  therapy.  He also has not had an ONO.  OV 10/31/2022: just went to  [place]  to visit family. He has been doing well. He got covid after he got back while traveling.  He is using his Trelegy daily.  Rarely using his albuterol .  CT scan of the chest completed recently with emphysema and a 4.3 cm thoracic aneurysm.   07/16/2023 - Interim hx  Discussed the use of AI scribe software for clinical note transcription with the patient, who gave verbal consent to proceed.  History of Present Illness   Benjamin [name] is a 73 year old male with COPD who presents for a one-year follow-up. He was previously under the care of Dr. Unk Garb, who has since relocated.  He has severe COPD, classified as stage 3, with a lung function of 39% predicted based on a breathing test conducted in 2021. He also has a history of asthma since birth, with a 12-point improvement in lung function after using a rescue inhaler during the test. His current medications include a Trelegy inhaler used every morning and albuterol  as a rescue inhaler, which he uses more frequently during periods of high pollen. He experiences wheezing but no chest tightness or cough unless sick. He has not been hospitalized or had any respiratory infections in the past three months.  In September 2024, a CT scan revealed emphysema and a new 5 mm lung nodule in the left lung, which requires monitoring with a follow-up CT scan scheduled for September 2025. He was  not previously aware of the nodule. He has a history of smoking, which contributed to his emphysema.  He uses a portable oxygen  concentrator primarily during flights or long outings, although he has a larger concentrator at home that he has never used. His oxygen  saturation is typically around 92%.  He exercises regularly at a gym three times a week, engaging in a 30-minute program that includes side-to-side machine exercises, arm pulls, and leg lifts.     Imaging: 11/12/22 CTA >> advanced bullous emphysema, particularly at the apices.  Architectural distortion in the  bilateral lower lungs.  Similar appearance of volume loss.  No enlarging nodularities.  New 5 mm lung nodule left lower lobe, not on contrast CT chest at 12 months is suggested given patient's risk factors.  Allergies  Allergen Reactions   Keflex [Cephalexin] Rash   Penicillins Rash    Immunization History  Administered Date(s) Administered   Influenza Split 11/10/2013, 12/11/2017   Influenza, High Dose Seasonal PF 02/10/2020   Influenza, Quadrivalent, Recombinant, Inj, Pf 12/15/2018   Influenza,inj,Quad PF,6+ Mos 01/09/2016, 01/21/2017   Influenza,inj,quad, With Preservative 01/05/2015   Influenza-Unspecified 01/05/2015   PFIZER(Purple Top)SARS-COV-2 Vaccination 04/11/2019, 05/05/2019, 11/17/2019   Pneumococcal Conjugate-13 02/02/2015   Pneumococcal Polysaccharide-23 09/26/2010, 01/24/2021   Pneumococcal-Unspecified 09/26/2010   Tdap 08/16/2008    Past Medical History:  Diagnosis Date   Alcohol abuse    Ascending aorta dilation (HCC)    BPH (benign prostatic hyperplasia)    Cirrhosis of liver (HCC)    COPD (chronic obstructive pulmonary disease) (HCC)    Diverticulosis    ED (erectile dysfunction)    External hemorrhoid    Heart failure (HCC)    Hepatitis C without hepatic coma    Hypertension    Internal hemorrhoid    Kidney stone    Male erectile dysfunction, unspecified    Personal history of colonic adenomas 05/15/2012   Personal history of colonic polyps     Tobacco History: Social History   Tobacco Use  Smoking Status Former   Current packs/day: 0.00   Average packs/day: 2.0 packs/day for 30.0 years (60.0 ttl pk-yrs)   Types: Cigarettes   Start date: 73   Quit date: 1999   Years since quitting: 26.3  Smokeless Tobacco Never   Counseling given: Not Answered   Outpatient Medications Prior to Visit  Medication Sig Dispense Refill   albuterol  (PROVENTIL ) (2.5 MG/3ML) 0.083% nebulizer solution Take 3 mLs (2.5 mg total) by nebulization every 4 (four)  hours as needed for wheezing or shortness of breath. 75 mL 12   albuterol  (VENTOLIN  HFA) 108 (90 Base) MCG/ACT inhaler Inhale 2 puffs into the lungs every 6 (six) hours as needed for wheezing or shortness of breath. 8.5 g 5   alfuzosin (UROXATRAL) 10 MG 24 hr tablet Take 10 mg by mouth daily with breakfast.     Ascorbic Acid (VITAMIN C) 1000 MG tablet Take 1,000 mg by mouth daily.     aspirin  EC 81 MG tablet Take 1 tablet (81 mg total) by mouth daily. Swallow whole.     CIALIS 5 MG tablet Take 5 mg by mouth daily as needed for erectile dysfunction.     Fluticasone-Umeclidin-Vilant (TRELEGY ELLIPTA ) 100-62.5-25 MCG/ACT AEPB Inhale 1 puff into the lungs daily. 60 each 2   ibuprofen  (ADVIL ,MOTRIN ) 600 MG tablet Take 1 tablet (600 mg total) by mouth every 8 (eight) hours as needed. 15 tablet 0   lisinopril (ZESTRIL) 40 MG tablet Take 40 mg by mouth daily.  Multiple Vitamin (MULTIVITAMIN) tablet Take 1 tablet by mouth daily.     rosuvastatin  (CRESTOR ) 10 MG tablet Take 1 tablet (10 mg total) by mouth daily. 90 tablet 3   No facility-administered medications prior to visit.   Review of Systems  Review of Systems  Constitutional: Negative.   HENT: Negative.    Respiratory:  Positive for wheezing. Negative for cough, chest tightness and shortness of breath.    Physical Exam  There were no vitals taken for this visit. Physical Exam Constitutional:      General: He is not in acute distress.    Appearance: Normal appearance. He is not ill-appearing.  HENT:     Head: Normocephalic and atraumatic.     Mouth/Throat:     Mouth: Mucous membranes are moist.     Pharynx: Oropharynx is clear.  Cardiovascular:     Rate and Rhythm: Normal rate and regular rhythm.  Pulmonary:     Effort: Pulmonary effort is normal.     Breath sounds: Normal breath sounds. No rhonchi or rales.     Comments: Dull exp wheeze lower lobe R>L Musculoskeletal:        General: Normal range of motion.  Skin:     General: Skin is warm and dry.  Neurological:     General: No focal deficit present.     Mental Status: He is alert and oriented to person, place, and time. Mental status is at baseline.  Psychiatric:        Mood and Affect: Mood normal.        Behavior: Behavior normal.        Thought Content: Thought content normal.        Judgment: Judgment normal.     Lab Results:  CBC    Component Value Date/Time   WBC 9.0 10/07/2019 1640   RBC 4.54 10/07/2019 1640   HGB 14.6 10/07/2019 1640   HCT 43.6 10/07/2019 1640   PLT 187.0 10/07/2019 1640   MCV 96.0 10/07/2019 1640   MCH 30.6 12/24/2013 0951   MCHC 33.4 10/07/2019 1640   RDW 13.3 10/07/2019 1640   LYMPHSABS 2.2 10/07/2019 1640   MONOABS 0.6 10/07/2019 1640   EOSABS 0.0 10/07/2019 1640   BASOSABS 0.1 10/07/2019 1640    BMET    Component Value Date/Time   NA 134 (L) 12/25/2013 0010   K 4.5 12/25/2013 0010   CL 98 12/25/2013 0010   CO2 25 12/25/2013 0010   GLUCOSE 105 (H) 12/25/2013 0010   BUN 16 12/25/2013 0010   CREATININE 1.66 (H) 12/25/2013 0010   CALCIUM  9.5 12/25/2013 0010   GFRNONAA 43 (L) 12/25/2013 0010   GFRAA 50 (L) 12/25/2013 0010    BNP No results found for: "BNP"  ProBNP No results found for: "PROBNP"  Imaging: No results found.   Assessment & Plan:   1. Former smoker (Primary)  2. Pulmonary nodule - CT ANGIO CHEST AORTA W/CM & OR WO/CM; Future  3. Stage 3 severe COPD by GOLD classification (HCC) - Fluticasone-Umeclidin-Vilant (TRELEGY ELLIPTA ) 100-62.5-25 MCG/ACT AEPB; Inhale 1 puff into the lungs daily.  Dispense: 60 each; Refill: 11  4. Aortic aneurysm without rupture, unspecified portion of aorta (HCC) - CT ANGIO CHEST AORTA W/CM & OR WO/CM; Future  Assessment and Plan    Severe COPD with asthma features Severe COPD with asthma features, stage 3, with lung function at 39% predicted since 2021. Asthmatic component indicated by 12-point improvement post-albuterol . Currently on daily  Trelegy 100mcg and  as-needed albuterol , especially with increased pollen. No recent hospitalizations or respiratory infections. Occasional wheezing, no chest tightness or cough unless ill. Oxygen  saturation around 92%.  - Refill Trelegy 100mcg inhaler. - Monitor oxygen  saturation during exercise. - Educate on potential Trelegy dose increase if wheezing or albuterol  use escalates.  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 Oxygen  concentrator available but infrequently used. Participates in regular exercise. Emphasized exercise, pulmonary rehab, weight maintenance, and smoking cessation for management. Discussed emphysema's impact on oxygen  diffusion due to alveolar destruction and the role of lifestyle changes in reducing oxygen  needs. - Encourage continued exercise and pulmonary rehabilitation. - Advise on weight maintenance and smoking avoidance.  Lung nodule monitoring New 5 mm lung nodule identified on CT in September 2024. No other new or enlarging nodules. Requires monitoring for changes. - Order follow-up CT in September 2025 to monitor lung nodule.  Aortic aneurysm - Stable; greatest estimated diameter of ascending aorta measures 41mm - Needs annual follow-up with CTA imaging    Antonio Baumgarten, NP 07/16/2023

## 2023-11-05 NOTE — Progress Notes (Unsigned)
 Cardiology Clinic Note   Patient Name: Benjamin [name] Date of Encounter: 11/07/2023  Primary Care Provider:  Delayne Artist PARAS, MD Primary Cardiologist:  None  Patient Profile    Benjamin [name] 73 year old male presents to the clinic today for follow-up evaluation of his hypertension and hyperlipidemia.  Past Medical History    Past Medical History:  Diagnosis Date   Alcohol abuse    Ascending aorta dilation (HCC)    BPH (benign prostatic hyperplasia)    Cirrhosis of liver (HCC)    COPD (chronic obstructive pulmonary disease) (HCC)    Diverticulosis    ED (erectile dysfunction)    External hemorrhoid    Heart failure (HCC)    Hepatitis C without hepatic coma    Hypertension    Internal hemorrhoid    Kidney stone    Male erectile dysfunction, unspecified    Personal history of colonic adenomas 05/15/2012   Personal history of colonic polyps    Past Surgical History:  Procedure Laterality Date   COLONOSCOPY W/ POLYPECTOMY     dental implants     upper   HEMORRHOID SURGERY     LEG SURGERY Right    fx- right repair plate screw    Allergies  Allergies  Allergen Reactions   Keflex [Cephalexin] Rash   Penicillins Rash    History of Present Illness    Benjamin [name] has PMH of prior alcohol abuse, ascending aortic dilation, cirrhosis, hypertension, hyperlipidemia, former tobacco abuse, COPD, and was initially referred by his PCP for evaluation of abnormal coronary calcium  score.  Echocardiogram 7/24 showed an LVEF of 45-50% with global hypokinesis, aortic valve sclerosis, mild dilation of ascending aorta measuring 4.1 cm.  A noncontrast CT chest for calcium  scoring 7/24 showed 4.3 cm ascending aorta.  His coronary calcium  score was 1026.  He presented for evaluation with Dr. Verlin 11/07/2022.  During that time he denied chest pain.  He did note baseline dyspnea which was attributed to his COPD.  He reported that he was no longer drinking alcohol.  His blood pressure  was noted to be 116/84.  He was started on aspirin  81 mg and rosuvastatin  10 mg daily.  Fasting lipids and LFTs were planned for 12 weeks.  He was following with Dr. Brenna for his aortic aneurysm and he had a planned follow-up CT scheduled for the following week.  He presents to the clinic today for follow-up evaluation and states he has been doing well.  He has been working out 3 days/week with a Psychologist, educational.  He denies chest pain.  He has been using his inhalers regularly.  We reviewed his medication regimen and recommendations for continuation.  He notes that he has not had recent fasting lipids.  He is fasting today.  I will order these.  I will continue his low-sodium diet.  We reviewed his ascending aneurysm.  He expressed understanding.  I will repeat echocardiogram for further screening/surveillance and plan follow-up in 12 months..  Today he denies chest pain, shortness of breath, lower extremity edema, fatigue, palpitations, melena, hematuria, hemoptysis, diaphoresis, weakness, presyncope, syncope, orthopnea, and PND.    Home Medications    Prior to Admission medications   Medication Sig Start Date End Date Taking? Authorizing Provider  albuterol  (PROVENTIL ) (2.5 MG/3ML) 0.083% nebulizer solution Take 3 mLs (2.5 mg total) by nebulization every 4 (four) hours as needed for wheezing or shortness of breath. 07/13/19   Brenna Adine CROME, DO  albuterol  (VENTOLIN  HFA) 108 (  90 Base) MCG/ACT inhaler Inhale 2 puffs into the lungs every 6 (six) hours as needed for wheezing or shortness of breath. 07/16/23   Hope Almarie ORN, NP  alfuzosin (UROXATRAL) 10 MG 24 hr tablet Take 10 mg by mouth daily with breakfast.    [provider]  Ascorbic Acid (VITAMIN C) 1000 MG tablet Take 1,000 mg by mouth daily.    [provider]  aspirin  EC 81 MG tablet Take 1 tablet (81 mg total) by mouth daily. Swallow whole. 11/07/22   Verlin Lonni BIRCH, MD  CIALIS 5 MG tablet Take 5 mg by mouth daily as  needed for erectile dysfunction. 12/14/13   [provider]  Fluticasone-Umeclidin-Vilant (TRELEGY ELLIPTA ) 100-62.5-25 MCG/ACT AEPB Inhale 1 puff into the lungs daily. 07/16/23   Hope Almarie ORN, NP  ibuprofen  (ADVIL ,MOTRIN ) 600 MG tablet Take 1 tablet (600 mg total) by mouth every 8 (eight) hours as needed. 12/24/13   Baxter Drivers, MD  lisinopril (ZESTRIL) 40 MG tablet Take 40 mg by mouth daily. 08/24/22   [provider]  Multiple Vitamin (MULTIVITAMIN) tablet Take 1 tablet by mouth daily.    [provider]  rosuvastatin  (CRESTOR ) 10 MG tablet Take 1 tablet (10 mg total) by mouth daily. 11/07/22   Verlin Lonni BIRCH, MD    Family History    Family History  Problem Relation Age of Onset   Ovarian cancer Mother    Thyroid  disease Mother    Leukemia Mother    Bone cancer Mother    Prostate cancer Father    Hypertension Father    Colon cancer Neg Hx    He indicated that his mother is deceased. He indicated that his father is alive. He indicated that the status of his neg hx is unknown.  Social History    Social History   Socioeconomic History   Marital status: Married    Spouse name: Not on file   Number of children: 2   Years of education: Not on file   Highest education level: Not on file  Occupational History   Occupation: Retired-electric Horticulturist, commercial  Tobacco Use   Smoking status: Former    Current packs/day: 0.00    Average packs/day: 2.0 packs/day for 30.0 years (60.0 ttl pk-yrs)    Types: Cigarettes    Start date: 54    Quit date: 1999    Years since quitting: 26.6   Smokeless tobacco: Never  Substance and Sexual Activity   Alcohol use: No    Alcohol/week: 0.0 standard drinks of alcohol   Drug use: Yes    Types: Marijuana   Sexual activity: Not on file  Other Topics Concern   Not on file  Social History Narrative   Not on file   Social Drivers of Health   Financial Resource Strain: Not on file  Food Insecurity: Not  on file  Transportation Needs: Not on file  Physical Activity: Not on file  Stress: Not on file  Social Connections: Not on file  Intimate Partner Violence: Not on file     Review of Systems    General:  No chills, fever, night sweats or weight changes.  Cardiovascular:  No chest pain, dyspnea on exertion, edema, orthopnea, palpitations, paroxysmal nocturnal dyspnea. Dermatological: No rash, lesions/masses Respiratory: No cough, dyspnea Urologic: No hematuria, dysuria Abdominal:   No nausea, vomiting, diarrhea, bright red blood per rectum, melena, or hematemesis Neurologic:  No visual changes, wkns, changes in mental status. All other systems reviewed and  are otherwise negative except as noted above.  Physical Exam    VS:  BP 118/68 (BP Location: Right Arm, Patient Position: Sitting, Cuff Size: Normal)   Pulse 62   Ht 5' 10 (1.778 m)   Wt 180 lb 9.6 oz (81.9 kg)   SpO2 93%   BMI 25.91 kg/m  , BMI Body mass index is 25.91 kg/m. GEN: Well nourished, well developed, in no acute distress. HEENT: normal. Neck: Supple, no JVD, carotid bruits, or masses. Cardiac: RRR, no murmurs, rubs, or gallops. No clubbing, cyanosis, edema.  Radials/DP/PT 2+ and equal bilaterally.  Respiratory:  Respirations regular and unlabored, clear to auscultation bilaterally. GI: Soft, nontender, nondistended, BS + x 4. MS: no deformity or atrophy. Skin: warm and dry, no rash. Neuro:  Strength and sensation are intact. Psych: Normal affect.  Accessory Clinical Findings    Recent Labs: No results found for requested labs within last 365 days.   Recent Lipid Panel No results found for: CHOL, TRIG, HDL, CHOLHDL, VLDL, LDLCALC, LDLDIRECT       ECG personally reviewed by me today- EKG Interpretation Date/Time:  Thursday November 07 2023 15:26:39 EDT Ventricular Rate:  62 PR Interval:  150 QRS Duration:  82 QT Interval:  418 QTC Calculation: 424 R Axis:   -48  Text  Interpretation: Normal sinus rhythm with sinus arrhythmia Left axis deviation Nonspecific ST abnormality When compared with ECG of 07-Nov-2022 15:22, Premature ventricular complexes are no longer Present Nonspecific T wave abnormality now evident in Inferior leads Confirmed by Emelia Hazy [phone]) on 11/07/2023 3:32:12 PM      CT angio chest aorta 11/12/2022  FINDINGS: Cardiovascular:   Heart:   No cardiomegaly. No pericardial fluid/thickening. Calcifications of left anterior descending, circumflex, right coronary arteries.   Aorta:   Minimal aortic valve calcifications.   Greatest estimated diameter of the aortic annulus 31 mm on the coronal reformatted images.   Greatest estimated diameter of the sino-tubular junction, 31 mm on the coronal images.   Greatest estimated diameter of the ascending aorta on the axial images, 41 mm.   Mild atherosclerotic changes of the aorta. Branch vessels are patent with a 3 vessel arch. Cervical cerebral vessels patent at the base of the neck.   No pedunculated plaque, ulcerated plaque, dissection, periaortic fluid. No wall thickening.   Pulmonary arteries:   Timing of the contrast bolus is not optimized for evaluation of pulmonary artery filling defects. Unremarkable size of the main pulmonary artery.   Mediastinum/Nodes: No mediastinal adenopathy. Patulous thoracic esophagus with some retained material at the distal thoracic esophagus. Small hiatal hernia.   Unremarkable appearance of the thoracic inlet.   Lungs/Pleura: Advanced bullous emphysema again noted, particularly at the apices. Architectural distortion in the bilateral lungs. Similar appearance of volume loss involving the anterior segment of the right upper lobe. No enlarging nodularity in this region of volume loss.   Redemonstration of volume loss in both the medial and lateral segment of the right middle lobe. No enlarging nodularity in this region. New 5 mm  nodule   At the left lung base cardiophrenic sulcus.   Upper Abdomen: No acute finding of the upper abdomen.   Musculoskeletal: No acute displaced fracture. Degenerative changes of the spine.   Review of the MIP images confirms the above findings.   IMPRESSION: Relatively unchanged size and configuration of the ascending aorta, estimated 4.1 cm on the current CT. Recommend annual imaging followup by CTA or MRA. This recommendation follows 2010 ACCF/AHA/AATS/ACR/ASA/SCA/SCAI/SIR/STS/SVM Guidelines  for the Diagnosis and Management of Patients with Thoracic Aortic Disease. Circulation. 2010; 121: Z733-z630. Aortic aneurysm NOS (ICD10-I71.9)   New 5 mm nodule at the base of the left lung. Noncontrast chest CT at 12 months is suggested given the patient's apparent risk factors.This recommendation follows the consensus statement: Guidelines for Management of Incidental Pulmonary Nodules Detected on CT Images: From the Fleischner Society 2017; Radiology 2017; 284:228-243.   Redemonstration of advanced bullous emphysema, chronic volume loss/atelectasis involving the right upper lobe and right middle lobe.   Patulous thoracic esophagus with some retained material. Correlation with any symptoms of dysphagia may be useful.   Aortic atherosclerosis and coronary artery disease. Aortic Atherosclerosis (ICD10-I70.0).   Echocardiogram 09/26/2022 IMPRESSIONS     1. Left ventricular ejection fraction, by estimation, is 45 to 50%. Left  ventricular ejection fraction by 3D volume is 47 %. The left ventricle has  mildly decreased function. The left ventricle demonstrates global  hypokinesis. There is mild concentric  left ventricular hypertrophy. Left ventricular diastolic parameters are  consistent with Grade I diastolic dysfunction (impaired relaxation).   2. Right ventricular systolic function is normal. The right ventricular  size is normal.   3. The mitral valve is grossly normal.  Trivial mitral valve  regurgitation. No evidence of mitral stenosis.   4. The aortic valve is tricuspid. There is mild calcification of the  aortic valve. There is mild thickening of the aortic valve. Aortic valve  regurgitation is not visualized. Aortic valve sclerosis/calcification is  present, without any evidence of  aortic stenosis.   5. Aortic dilatation noted. There is borderline dilatation of the aortic  root, measuring 40 mm. There is mild dilatation of the ascending aorta,  measuring 41 mm.   6. The inferior vena cava is normal in size with greater than 50%  respiratory variability, suggesting right atrial pressure of 3 mmHg.   Comparison(s): No prior Echocardiogram.   FINDINGS   Left Ventricle: Left ventricular ejection fraction, by estimation, is 45  to 50%. Left ventricular ejection fraction by 3D volume is 47 %. The left  ventricle has mildly decreased function. The left ventricle demonstrates  global hypokinesis. Global  longitudinal strain performed but not reported based on interpreter  judgement due to suboptimal tracking. The left ventricular internal cavity  size was normal in size. There is mild concentric left ventricular  hypertrophy. Left ventricular diastolic  parameters are consistent with Grade I diastolic dysfunction (impaired  relaxation).   Right Ventricle: The right ventricular size is normal. No increase in  right ventricular wall thickness. Right ventricular systolic function is  normal.   Left Atrium: Left atrial size was normal in size.   Right Atrium: Right atrial size was normal in size.   Pericardium: There is no evidence of pericardial effusion.   Mitral Valve: The mitral valve is grossly normal. Trivial mitral valve  regurgitation. No evidence of mitral valve stenosis.   Tricuspid Valve: The tricuspid valve is normal in structure. Tricuspid  valve regurgitation is not demonstrated.   Aortic Valve: The aortic valve is tricuspid. There  is mild calcification  of the aortic valve. There is mild thickening of the aortic valve. Aortic  valve regurgitation is not visualized. Aortic valve  sclerosis/calcification is present, without any  evidence of aortic stenosis.   Pulmonic Valve: The pulmonic valve was normal in structure. Pulmonic valve  regurgitation is trivial.   Aorta: Aortic dilatation noted. There is borderline dilatation of the  aortic root, measuring 40 mm.  There is mild dilatation of the ascending  aorta, measuring 41 mm.   Venous: The inferior vena cava is normal in size with greater than 50%  respiratory variability, suggesting right atrial pressure of 3 mmHg.   IAS/Shunts: The atrial septum is grossly normal.    Coronary calcium  scoring 09/26/2022  FINDINGS: Coronary arteries: Normal origins.   Coronary Calcium  Score:   Left main: 7.12   Left anterior descending artery: 613   Left circumflex artery: 205   Right coronary artery: 201   Total: 1026   Percentile: 85th   Pericardium: Normal.   Ascending Aorta: Dilated to 43 mm.   Non-cardiac: See separate report from [facility].   IMPRESSION: Coronary calcium  score of 1026 Agatston units. This was 85th percentile for age-, race-, and sex-matched controls.   Dilated ascending aorta to 4.3 cm. Consider formal CTA chest or MRA chest to fully evaluate.   RECOMMENDATIONS: Coronary artery calcium  (CAC) score is a strong predictor of incident coronary heart disease (CHD) and provides predictive information beyond traditional risk factors. CAC scoring is reasonable to use in the decision to withhold, postpone, or initiate statin therapy in intermediate-risk or selected borderline-risk asymptomatic adults (age 64-75 years and LDL-C >=70 to <190 mg/dL) who do not have diabetes or established atherosclerotic cardiovascular disease (ASCVD).* In intermediate-risk (10-year ASCVD risk >=7.5% to <20%) adults or selected borderline-risk  (10-year ASCVD risk >=5% to <7.5%) adults in whom a CAC score is measured for the purpose of making a treatment decision the following recommendations have been made:   If CAC=0, it is reasonable to withhold statin therapy and reassess in 5 to 10 years, as long as higher risk conditions are absent (diabetes mellitus, family history of premature CHD in first degree relatives (males <55 years; females <65 years), cigarette smoking, or LDL >=190 mg/dL).   If CAC is 1 to 99, it is reasonable to initiate statin therapy for patients >=63 years of age.   If CAC is >=100 or >=75th percentile, it is reasonable to initiate statin therapy at any age.   Cardiology referral should be considered for patients with CAC scores >=400 or >=75th percentile.   *2018 AHA/ACC/AACVPR/AAPA/ABC/ACPM/ADA/AGS/APhA/ASPC/NLA/PCNA Guideline on the Management of Blood Cholesterol: A Report of the American College of Cardiology/American Heart Association Task Force on Clinical Practice Guidelines. J Am Coll Cardiol. 2019;73(24):3168-3209.   Ezra Shuck, MD   Electronically Signed: By: Ezra Shuck M.D. On: 09/26/2022 16:46  Assessment & Plan   1.  Coronary artery disease-no chest pain today.  Denies episodes of exertional chest discomfort.  Underwent coronary calcium  scoring which showed a coronary calcium  score of 1026.  At that time he was placed on aspirin  and rosuvastatin . Heart healthy low-sodium diet Increase physical activity as tolerated Continue aspirin , rosuvastatin   Hyperlipidemia-lipids and lfts to be drawn today High-fiber diet Continue aspirin , rosuvastatin   Aortic aneurysm-CT angio chest 11/12/2022 showed ascending aortic aneurysm measuring 41 mm. Maintain good blood pressure Heart healthy low-sodium diet Repeat echo  Disposition: Follow-up with Dr. Verlin or me in 12 months.   Benjamin HERO. Kieara Schwark NP-C     11/07/2023, 3:46 PM [facility] [address] [place], [place] [address] Office [phone]      I spent 14 minutes examining this patient, reviewing medications, and using patient centered shared decision making involving their cardiac care.   I spent  20 minutes reviewing past medical history,  medications, and prior cardiac tests.

## 2023-11-07 ENCOUNTER — Ambulatory Visit: Attending: General Practice | Admitting: General Practice

## 2023-11-07 ENCOUNTER — Encounter: Payer: Self-pay | Admitting: General Practice

## 2023-11-07 VITALS — BP 118/68 | HR 62 | Ht 70.0 in | Wt 180.6 lb

## 2023-11-07 DIAGNOSIS — I2510 Atherosclerotic heart disease of native coronary artery without angina pectoris: Secondary | ICD-10-CM | POA: Diagnosis not present

## 2023-11-07 DIAGNOSIS — I712 Thoracic aortic aneurysm, without rupture, unspecified: Secondary | ICD-10-CM

## 2023-11-07 DIAGNOSIS — E782 Mixed hyperlipidemia: Secondary | ICD-10-CM | POA: Diagnosis not present

## 2023-11-07 LAB — LIPID PANEL

## 2023-11-07 NOTE — Patient Instructions (Signed)
 Medication Instructions:  Your physician recommends that you continue on your current medications as directed. Please refer to the Current Medication list given to you today.  *If you need a refill on your cardiac medications before your next appointment, please call your pharmacy*  Lab Work: TODAY: LIPIDS, LFTS  If you have labs (blood work) drawn today and your tests are completely normal, you will receive your results only by: MyChart Message (if you have MyChart) OR A paper copy in the mail If you have any lab test that is abnormal or we need to change your treatment, we will call you to review the results.  Testing/Procedures: Your physician has requested that you have an echocardiogram. Echocardiography is a painless test that uses sound waves to create images of your heart. It provides your doctor with information about the size and shape of your heart and how well your heart's chambers and valves are working. This procedure takes approximately one hour. There are no restrictions for this procedure. Please do NOT wear cologne, perfume, aftershave, or lotions (deodorant is allowed). Please arrive 15 minutes prior to your appointment time.  Please note: We ask at that you not bring children with you during ultrasound (echo/ vascular) testing. Due to room size and safety concerns, children are not allowed in the ultrasound rooms during exams. Our front office staff cannot provide observation of children in our lobby area while testing is being conducted. An adult accompanying a patient to their appointment will only be allowed in the ultrasound room at the discretion of the ultrasound technician under special circumstances. We apologize for any inconvenience.   Follow-Up: At [facility], you and your health needs are our priority.  As part of our continuing mission to provide you with exceptional heart care, our providers are all part of one team.  This team includes your primary  Cardiologist (physician) and Advanced Practice Providers or APPs (Physician Assistants and Nurse Practitioners) who all work together to provide you with the care you need, when you need it.  Your next appointment:   1 year(s)  Provider:   One of our Advanced Practice Providers (APPs): Morse Clause, PA-C  Lamarr Satterfield, NP Miriam Shams, NP  Olivia Pavy, PA-C Josefa Beauvais, NP  Leontine Salen, PA-C Orren Fabry, PA-C  Chamois, PA-C Ernest Dick, NP  Damien Braver, NP Jon Hails, PA-C  Waddell Donath, PA-C    Dayna Dunn, PA-C  Scott Weaver, PA-C Lum Louis, NP Katlyn West, NP Callie Goodrich, PA-C  Evan Williams, PA-C Sheng Haley, PA-C  Xika Zhao, NP Kathleen Johnson, PA-C   We recommend signing up for the patient portal called MyChart.  Sign up information is provided on this After Visit Summary.  MyChart is used to connect with patients for Virtual Visits (Telemedicine).  Patients are able to view lab/test results, encounter notes, upcoming appointments, etc.  Non-urgent messages can be sent to your provider as well.   To learn more about what you can do with MyChart, go to [email or web address].   Other Instructions Exercise regularly as told by your doctor. Make sure to weight daily and keep a weight log.   Moderate-intensity exercise is any activity that gets you moving enough to burn at least three times more energy (calories) than if you were sitting. Examples of moderate exercise include: Walking a mile in 15 minutes. Doing light yard work. Biking at an easy pace. Most people should get at least 150 minutes of moderate-intensity exercise a week to maintain  their body weight.  Increase your water intake: Maintain hydration    DASH Eating Plan DASH stands for Dietary Approaches to Stop Hypertension. The DASH eating plan is a healthy eating plan that has been shown to: Lower high blood pressure (hypertension). Reduce your risk for type 2 diabetes, heart disease,  and stroke. Help with weight loss. What are tips for following this plan? Reading food labels Check food labels for the amount of salt (sodium) per serving. Choose foods with less than 5 percent of the Daily Value (DV) of sodium. In general, foods with less than 300 milligrams (mg) of sodium per serving fit into this eating plan. To find whole grains, look for the word whole as the first word in the ingredient list. Shopping Buy products labeled as low-sodium or no salt added. Buy fresh foods. Avoid canned foods and pre-made or frozen meals. Cooking Try not to add salt when you cook. Use salt-free seasonings or herbs instead of table salt or sea salt. Check with your health care provider or pharmacist before using salt substitutes. Do not fry foods. Cook foods in healthy ways, such as baking, boiling, grilling, roasting, or broiling. Cook using oils that are good for your heart. These include olive, canola, avocado, soybean, and sunflower oil. Meal planning  Eat a balanced diet. This should include: 4 or more servings of fruits and 4 or more servings of vegetables each day. Try to fill half of your plate with fruits and vegetables. 6-8 servings of whole grains each day. 6 or less servings of lean meat, poultry, or fish each day. 1 oz is 1 serving. A 3 oz (85 g) serving of meat is about the same size as the palm of your hand. One egg is 1 oz (28 g). 2-3 servings of low-fat dairy each day. One serving is 1 cup (237 mL). 1 serving of nuts, seeds, or beans 5 times each week. 2-3 servings of heart-healthy fats. Healthy fats called omega-3 fatty acids are found in foods such as walnuts, flaxseeds, fortified milks, and eggs. These fats are also found in cold-water fish, such as sardines, salmon, and mackerel. Limit how much you eat of: Canned or prepackaged foods. Food that is high in trans fat, such as fried foods. Food that is high in saturated fat, such as fatty meat. Desserts and other  sweets, sugary drinks, and other foods with added sugar. Full-fat dairy products. Do not salt foods before eating. Do not eat more than 4 egg yolks a week. Try to eat at least 2 vegetarian meals a week. Eat more home-cooked food and less restaurant, buffet, and fast food. Lifestyle When eating at a restaurant, ask if your food can be made with less salt or no salt. If you drink alcohol: Limit how much you have to: 0-1 drink a day if you are male. 0-2 drinks a day if you are male. Know how much alcohol is in your drink. In the [place] one drink is one 12 oz bottle of beer (355 mL), one 5 oz glass of wine (148 mL), or one 1 oz glass of hard liquor (44 mL). General information Avoid eating more than 2,300 mg of salt a day. If you have hypertension, you may need to reduce your sodium intake to 1,500 mg a day. Work with your provider to stay at a healthy body weight or lose weight. Ask what the best weight range is for you. On most days of the week, get at least 30 minutes of  exercise that causes your heart to beat faster. This may include walking, swimming, or biking. Work with your provider or dietitian to adjust your eating plan to meet your specific calorie needs. What foods should I eat? Fruits All fresh, dried, or frozen fruit. Canned fruits that are in their natural juice and do not have sugar added to them. Vegetables Fresh or frozen vegetables that are raw, steamed, roasted, or grilled. Low-sodium or reduced-sodium tomato and vegetable juice. Low-sodium or reduced-sodium tomato sauce and tomato paste. Low-sodium or reduced-sodium canned vegetables. Grains Whole-grain or whole-wheat bread. Whole-grain or whole-wheat pasta. Brown rice. [place]ira. Bulgur. Whole-grain and low-sodium cereals. Pita bread. Low-fat, low-sodium crackers. Whole-wheat flour tortillas. Meats and other proteins Skinless chicken or [place]. Ground chicken or [place]. Pork with fat trimmed off. Fish and seafood.  Egg whites. Dried beans, peas, or lentils. Unsalted nuts, nut butters, and seeds. Unsalted canned beans. Lean cuts of beef with fat trimmed off. Low-sodium, lean precooked or cured meat, such as sausages or meat loaves. Dairy Low-fat (1%) or fat-free (skim) milk. Reduced-fat, low-fat, or fat-free cheeses. Nonfat, low-sodium ricotta or cottage cheese. Low-fat or nonfat yogurt. Low-fat, low-sodium cheese. Fats and oils Soft margarine without trans fats. Vegetable oil. Reduced-fat, low-fat, or light mayonnaise and salad dressings (reduced-sodium). Canola, safflower, olive, avocado, soybean, and sunflower oils. Avocado. Seasonings and condiments Herbs. Spices. Seasoning mixes without salt. Other foods Unsalted popcorn and pretzels. Fat-free sweets. The items listed above may not be all the foods and drinks you can have. Talk to a dietitian to learn more. What foods should I avoid? Fruits Canned fruit in a light or heavy syrup. Fried fruit. Fruit in cream or butter sauce. Vegetables Creamed or fried vegetables. Vegetables in a cheese sauce. Regular canned vegetables that are not marked as low-sodium or reduced-sodium. Regular canned tomato sauce and paste that are not marked as low-sodium or reduced-sodium. Regular tomato and vegetable juices that are not marked as low-sodium or reduced-sodium. Dene. Olives. Grains Baked goods made with fat, such as croissants, muffins, or some breads. Dry pasta or rice meal packs. Meats and other proteins Fatty cuts of meat. Ribs. Fried meat. [place]. Bologna, salami, and other precooked or cured meats, such as sausages or meat loaves, that are not lean and low in sodium. Fat from the back of a pig (fatback). Bratwurst. Salted nuts and seeds. Canned beans with added salt. Canned or smoked fish. Whole eggs or egg yolks. Chicken or [place] with skin. Dairy Whole or 2% milk, cream, and half-and-half. Whole or full-fat cream cheese. Whole-fat or sweetened yogurt.  Full-fat cheese. Nondairy creamers. Whipped toppings. Processed cheese and cheese spreads. Fats and oils Butter. Stick margarine. Lard. Shortening. Ghee. Bacon fat. Tropical oils, such as coconut, palm kernel, or palm oil. Seasonings and condiments Onion salt, garlic salt, seasoned salt, table salt, and sea salt. Worcestershire sauce. Tartar sauce. Barbecue sauce. Teriyaki sauce. Soy sauce, including reduced-sodium soy sauce. Steak sauce. Canned and packaged gravies. Fish sauce. Oyster sauce. Cocktail sauce. Store-bought horseradish. Ketchup. Mustard. Meat flavorings and tenderizers. Bouillon cubes. Hot sauces. Pre-made or packaged marinades. Pre-made or packaged taco seasonings. Relishes. Regular salad dressings. Other foods Salted popcorn and pretzels. The items listed above may not be all the foods and drinks you should avoid. Talk to a dietitian to learn more. Where to find more information National Heart, Lung, and Blood Institute (NHLBI): BuffaloDryCleaner.gl American Heart Association (AHA): heart.org Academy of Nutrition and Dietetics: eatright.org National Kidney Foundation (NKF): kidney.org This information is not  intended to replace advice given to you by your health care provider. Make sure you discuss any questions you have with your health care provider. Document Revised: 03/08/2022 Document Reviewed: 03/08/2022 Elsevier Patient Education  2024 [employer].

## 2023-11-08 ENCOUNTER — Ambulatory Visit: Payer: Self-pay | Admitting: General Practice

## 2023-11-08 LAB — LIPID PANEL
Cholesterol, Total: 142 mg/dL (ref 100–199)
HDL: 53 mg/dL (ref >39)
LDL CALC COMMENT:: 2.7 ratio (ref 0.0–5.0)
LDL Chol Calc (NIH): 71 mg/dL (ref 0–99)
Triglycerides: 99 mg/dL (ref 0–149)
VLDL Cholesterol Cal: 18 mg/dL (ref 5–40)

## 2023-11-08 LAB — HEPATIC FUNCTION PANEL
ALT: 24 IU/L (ref 0–44)
AST: 16 IU/L (ref 0–40)
Albumin: 4.6 g/dL (ref 3.8–4.8)
Alkaline Phosphatase: 47 IU/L (ref 44–121)
Bilirubin Total: 0.6 mg/dL (ref 0.0–1.2)
Bilirubin, Direct: 0.29 mg/dL (ref 0.00–0.40)
Total Protein: 7.2 g/dL (ref 6.0–8.5)

## 2023-11-14 ENCOUNTER — Other Ambulatory Visit: Payer: Self-pay

## 2023-11-14 DIAGNOSIS — Z79899 Other long term (current) drug therapy: Secondary | ICD-10-CM

## 2023-11-14 DIAGNOSIS — E782 Mixed hyperlipidemia: Secondary | ICD-10-CM

## 2023-11-14 MED ORDER — ROSUVASTATIN CALCIUM 20 MG PO TABS: 20.0000 mg | ORAL_TABLET | Freq: Every day | ORAL | 3 refills | Status: AC

## 2023-11-14 NOTE — Progress Notes (Signed)
 rosuvasting

## 2023-11-22 ENCOUNTER — Ambulatory Visit
Admission: RE | Admit: 2023-11-22 | Discharge: 2023-11-22 | Disposition: A | Discharge status: Home / Self Care | Source: Ambulatory Visit | Attending: Primary Care | Admitting: Primary Care

## 2023-11-22 DIAGNOSIS — R911 Solitary pulmonary nodule: Secondary | ICD-10-CM

## 2023-11-22 DIAGNOSIS — I719 Aortic aneurysm of unspecified site, without rupture: Secondary | ICD-10-CM

## 2023-11-22 MED ORDER — IOPAMIDOL (ISOVUE-370) INJECTION 76%
75.0000 mL | Freq: Once | INTRAVENOUS | Status: AC | PRN
  Administered 2023-11-22: 75 mL via INTRAVENOUS

## 2023-11-25 ENCOUNTER — Telehealth: Payer: Self-pay | Admitting: Primary Care

## 2023-11-25 ENCOUNTER — Ambulatory Visit: Payer: Self-pay | Admitting: Primary Care

## 2023-11-25 MED ORDER — TRELEGY ELLIPTA 100-62.5-25 MCG/ACT IN AEPB: 1.0000 | INHALATION_SPRAY | Freq: Every day | RESPIRATORY_TRACT | Status: AC

## 2023-11-25 NOTE — Telephone Encounter (Signed)
 Routing to the front desk to schedule PFT and f/u with Dr Shelah for the same day, Trelegy sample will be left up front so please just remind pt.

## 2023-11-25 NOTE — Progress Notes (Signed)
 I called and spoke to pt. Pt informed of Beth's note and verbalized understanding. Pt does request to personally speak to [facility] regarding some concerns. I informed pt that she will be able to speak to him when she has some free time outside of active pt clinic. Pt verbalized understanding. Routing to Coffey to call when she can.

## 2023-11-25 NOTE — Telephone Encounter (Signed)
 Patient needs visit with Dr. Shelah in November-January with 30 min PFTs prior Needs sample Trelegy 100 left up front for him

## 2023-11-25 NOTE — Progress Notes (Signed)
 Please let patient know CTA showed stable thoracic aortic aneurysm, measuring 4.1cm. Continue annual CTA imaging. Can be ordered by cardiothoracic surgery.   Stable emphysema and decreased size left lower pulmonary nodule now measuring 2-57mm.   Incidental finding coronary artery disease and enlarged pulmonary artery, follow up with either PCP or cardiology regarding these non urgent findings

## 2023-11-29 ENCOUNTER — Ambulatory Visit

## 2023-11-29 VITALS — BP 142/88 | HR 71 | Resp 20 | Ht 70.0 in | Wt 184.0 lb

## 2023-11-29 DIAGNOSIS — I7121 Aneurysm of the ascending aorta, without rupture: Secondary | ICD-10-CM | POA: Diagnosis not present

## 2023-11-29 NOTE — Patient Instructions (Signed)
 Risk Modification in those with ascending thoracic aortic aneurysm:   Continue control of blood pressure (prefer BP 130/80 or less)   2. Avoid fluoroquinolone antibiotics (I.e Ciprofloxacin, Avelox, Levofloxacin, Ofloxacin)   3.  Use of statin (to decrease cardiovascular risk)   4.  Exercise and activity limitations is individualized, but in general, contact sports are to be avoided and one should avoid heavy lifting (defined as half of ideal body weight) and exercises involving sustained Valsalva maneuver.   5.  Follow-up in one year with CTA of chest.

## 2023-11-29 NOTE — Progress Notes (Signed)
 [address] [place] [address]             [phone]            Benjamin [name] [id number] Dec 16, 1950   History of Present Illness:  Benjamin [name] is a 73 year old man with medical history of hypertension, COPD, asthma and hyperlipidemia who presents for continued follow up of ascending thoracic aortic aneurysm and pulmonary nodule.  CTA of chest on 11/22/2023 measured aortic aneurysm at 4.1 cm.  Pulmonary nodule had decreased in size.   He reports that he has been doing well.  His blood pressure is well controlled with current medication therapy.  He exercises 3 days a week with a personal trainer and states that he does not lift heavy.  He does have some shortness of breath but this is baseline with his COPD. He denies chest pain and lower leg swelling.    Current Outpatient Medications on File Prior to Visit  Medication Sig Dispense Refill   albuterol  (PROVENTIL ) (2.5 MG/3ML) 0.083% nebulizer solution Take 3 mLs (2.5 mg total) by nebulization every 4 (four) hours as needed for wheezing or shortness of breath. 75 mL 12   albuterol  (VENTOLIN  HFA) 108 (90 Base) MCG/ACT inhaler Inhale 2 puffs into the lungs every 6 (six) hours as needed for wheezing or shortness of breath. 8.5 g 5   alfuzosin (UROXATRAL) 10 MG 24 hr tablet Take 10 mg by mouth daily with breakfast.     Ascorbic Acid (VITAMIN C) 1000 MG tablet Take 1,000 mg by mouth daily.     aspirin  EC 81 MG tablet Take 1 tablet (81 mg total) by mouth daily. Swallow whole.     CIALIS 5 MG tablet Take 5 mg by mouth daily as needed for erectile dysfunction.     Fluticasone-Umeclidin-Vilant (TRELEGY ELLIPTA ) 100-62.5-25 MCG/ACT AEPB Inhale 1 puff into the lungs daily. 60 each 11   Fluticasone-Umeclidin-Vilant (TRELEGY ELLIPTA ) 100-62.5-25 MCG/ACT AEPB Inhale 1 puff into the lungs daily.     ibuprofen  (ADVIL ,MOTRIN ) 600 MG tablet Take 1 tablet (600 mg total) by mouth every 8 (eight) hours as needed. 15  tablet 0   lisinopril (ZESTRIL) 40 MG tablet Take 40 mg by mouth daily.     Multiple Vitamin (MULTIVITAMIN) tablet Take 1 tablet by mouth daily.     rosuvastatin  (CRESTOR ) 20 MG tablet Take 1 tablet (20 mg total) by mouth daily. 90 tablet 3   No current facility-administered medications on file prior to visit.     ROS: Review of Systems  Respiratory:  Positive for shortness of breath. Negative for cough and wheezing.        SOB is at baseline with his COPD, no exacerbation   Cardiovascular:  Negative for chest pain and leg swelling.     BP (!) 142/88 (BP Location: Left Arm)   Pulse 71   Resp 20   Ht 5' 10 (1.778 m)   Wt 184 lb (83.5 kg)   SpO2 91%   BMI 26.40 kg/m   Physical Exam Constitutional:      Appearance: Normal appearance.  HENT:     Head: Normocephalic and atraumatic.  Cardiovascular:     Rate and Rhythm: Normal rate and regular rhythm.     Heart sounds: Normal heart sounds, S1 normal and S2 normal.  Pulmonary:     Effort: Pulmonary effort is normal.     Breath sounds: Normal breath  sounds.  Skin:    General: Skin is warm and dry.  Neurological:     General: No focal deficit present.     Mental Status: He is alert and oriented to person, place, and time.      Imaging: CLINICAL DATA:  Follow-up aneurysmal disease of the ascending thoracic aorta and 5 mm left lower lobe pulmonary nodule.   EXAM: CT ANGIOGRAPHY CHEST WITH CONTRAST   TECHNIQUE: Multidetector CT imaging of the chest was performed using the standard protocol during bolus administration of intravenous contrast. Multiplanar CT image reconstructions and MIPs were obtained to evaluate the vascular anatomy.   RADIATION DOSE REDUCTION: This exam was performed according to the departmental dose-optimization program which includes automated exposure control, adjustment of the mA and/or kV according to patient size and/or use of iterative reconstruction technique.   CONTRAST:  75mL ISOVUE -370  IOPAMIDOL  (ISOVUE -370) INJECTION 76%   COMPARISON:  11/12/2022   FINDINGS: Cardiovascular: The aortic root measures approximately 4.0-4.1 cm in diameter at the level of the sinuses of Valsalva. The ascending thoracic aorta is stable in diameter measuring up to approximately 4.1 cm in maximum diameter. The proximal arch measures 3.2 cm and the distal arch 2.8 cm. The descending thoracic aorta measures 2.6 cm. Scattered atherosclerosis of the thoracic aorta. No dissection. Proximal great vessels demonstrate normal patency, mild atherosclerosis and normal branching anatomy.   The heart size is within normal limits. Calcified coronary artery plaque present. No pericardial fluid. Central pulmonary arteries are mildly dilated with the main pulmonary artery measuring 3.2 cm.   Mediastinum/Nodes: No enlarged mediastinal, hilar, or axillary lymph nodes. Thyroid  gland, trachea, and esophagus demonstrate no significant findings.   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 Previously noted 5 mm posterior left lower lobe nodule has diminished in size now measuring 2 x 3 mm and therefore benign. There is no evidence of pulmonary edema, consolidation, pneumothorax or pleural fluid.   Upper Abdomen: No acute abnormality.   Musculoskeletal: No chest wall abnormality. No acute or significant osseous findings.   Review of the MIP images confirms the above findings.   IMPRESSION: 1. Stable mild aneurysmal disease of the ascending thoracic aorta measuring up to 4.1 cm in estimated maximum diameter. Recommend annual imaging followup by CTA or MRA. This recommendation follows 2010 ACCF/AHA/AATS/ACR/ASA/SCA/SCAI/SIR/STS/SVM Guidelines for the Diagnosis and Management of Patients with Thoracic Aortic Disease. Circulation. 2010; 121: Z733-z630. Aortic aneurysm NOS (ICD10-I71.9) 2. 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 3. Previously noted 5 mm posterior left lower lobe nodule has diminished in size now measuring 2 x 3 mm and therefore benign. No further follow-up of this nodule needed. 4. Coronary atherosclerosis. 5. Mildly dilated central pulmonary arteries suggesting component of underlying pulmonary hypertension.   Aortic Atherosclerosis (ICD10-I70.0) and Emphysema (ICD10-J43.9).     Electronically Signed   By: Marcey Moan M.D.   On: 11/25/2023 10:03     A/P: Aneurysm of ascending aorta without rupture -4.1 cm ascending thoracic aortic aneurysm on CTA of chest. We discussed the natural history and and risk factors for growth of ascending aortic aneurysms. Discussed recommendations to minimize the risk of further expansion or dissection including careful blood pressure control, avoidance of contact sports and heavy lifting, attention to lipid management.  We covered the importance of continued smoking cessation.  The patient does not yet meet surgical criteria of >  5.5cm. The patient is aware of signs and symptoms of aortic dissection and when to present to the emergency department   -Follow up in one year with CTA of chest  Pulmonary Nodule -Previously noted 5 mm posterior left lower lobe nodule has diminished in size now measuring 2 x 3 mm  -Nodule benign and will continue to follow with yearly CTA for aneurysm   Risk Modification:  Statin:  rosuvastatin   Smoking cessation instruction/counseling given:  commended patient for quitting and reviewed strategies for preventing relapses  Patient was counseled on importance of Blood Pressure Control  They are instructed to contact their Primary Care Physician if they start to have blood pressure readings over 130s/90s. Do not ever stop blood pressure medications on your own, unless instructed by healthcare professional.  Please  avoid use of Fluoroquinolones as this can potentially increase your risk of Aortic Rupture and/or Dissection  Patient educated on signs and symptoms of Aortic Dissection, handout also provided in AVS  Manuelita CHRISTELLA Rough, PA-C 11/29/23

## 2023-12-02 ENCOUNTER — Ambulatory Visit: Admitting: Cardiovascular Disease

## 2023-12-23 ENCOUNTER — Ambulatory Visit (HOSPITAL_COMMUNITY)
Admission: RE | Admit: 2023-12-23 | Discharge: 2023-12-23 | Disposition: A | Discharge status: Home / Self Care | Source: Ambulatory Visit | Attending: Cardiology | Admitting: Cardiology

## 2023-12-23 DIAGNOSIS — I712 Thoracic aortic aneurysm, without rupture, unspecified: Secondary | ICD-10-CM | POA: Diagnosis present

## 2023-12-23 LAB — ECHOCARDIOGRAM COMPLETE
Area-P 1/2: 3.42 cm2
S' Lateral: 4 cm

## 2024-01-20 ENCOUNTER — Telehealth: Payer: Self-pay

## 2024-01-20 DIAGNOSIS — R911 Solitary pulmonary nodule: Secondary | ICD-10-CM

## 2024-01-20 NOTE — Telephone Encounter (Signed)
 Copied from CRM #[id number]. Topic: Clinical - Request for Lab/Test Order >> Jan 20, 2024 10:19 AM Russell PARAS wrote: Reason for CRM:   Pt contacting clinic to reschedule [facility] appt with [place] on 11/19. He reports being advised he had a PFT scheduled that day as well. PFT was canceled according to Appt Desk but was unable to reschedule due to order not showing in Active Requests.   Requested order be placed and receive call back to schedule  CB#  [phone]   I called and spoke to pt. Pt is scheduled to see Dr Shelah on 03-20-23. The order for a PFT is pending but not placed. Dr Shelah, how would you like for him to do his pft?

## 2024-01-21 NOTE — Telephone Encounter (Signed)
 I have placed new PFT order & scheduled pt PFT appt for 1/15 at 11am and the pt is aware. Nothing further needed.

## 2024-01-21 NOTE — Telephone Encounter (Signed)
 Would try to schedule full PFT at office, then OV on another day after so we can review. You could also offer him PFT at [facility], but need to make sure he understands that this may be more expensive due to facility fees

## 2024-01-22 ENCOUNTER — Encounter

## 2024-01-22 ENCOUNTER — Encounter: Admitting: Emergency Medicine

## 2024-02-04 ENCOUNTER — Ambulatory Visit: Payer: Self-pay | Admitting: Primary Care

## 2024-02-04 MED ORDER — PREDNISONE 10 MG PO TABS: ORAL_TABLET | ORAL | 0 refills | Status: AC

## 2024-02-04 MED ORDER — DOXYCYCLINE HYCLATE 100 MG PO TABS: 100.0000 mg | ORAL_TABLET | Freq: Two times a day (BID) | ORAL | 0 refills | Status: AC

## 2024-02-04 NOTE — Telephone Encounter (Signed)
 I called and spoke with patient, advised him that we have not seen him since May of 2025 and we could see him tomorrow afternoon.  He said he is unable to come in tomorrow afternoon, he is requesting that something be called in to his pharmacy.  He has an upcoming appointment in January of 2026.  I let him know I would send a message to Dr. Shelah and see what he says and then call him back.  He verbalized understanding.  He said the earliest he could come in would be Friday.  Dr. Shelah, Please advise.  Thank you.

## 2024-02-04 NOTE — Telephone Encounter (Signed)
 Prescriptions for doxycycline and prednisone taper sent to the patient's pharmacy

## 2024-02-04 NOTE — Telephone Encounter (Signed)
 FYI Only or Action Required?: Action required by provider: update on patient condition and medication request.  Patient is followed in Pulmonology for COPD, last seen on 07/16/2023 by Hope Almarie ORN, NP.  Called Nurse Triage reporting Cough.  Symptoms began several days ago.  Symptoms are: unchanged.  Triage Disposition: Call PCP When Office is Open  Patient/caregiver understands and will follow disposition?: Yes  Copied from CRM #[id number]. Topic: Clinical - Red Word Triage >> Feb 04, 2024 10:28 AM Leila C wrote: Red Word that prompted transfer to Nurse Triage: Patient [phone] states has COPD thinks he has bronchitis from patient's son visiting from [place] , symptoms is coughing phlegm green, nasal and chest congestion, shortness of breath, wheezing, and wants an antibiotics prescription. Patient denies fever, pain, nor dizziness. Patient last saw NP, Hope 07/16/23 and has upcoming appointment with Dr. Shelah 03/19/24 TOC. Please advise.   [id number] [place], [place] - [address] AT [facility] [address] & [facility] [address] [place] [address] Phone: [phone] Fax: [phone]  Allergic to PCN, rxn to keflex  Reason for Disposition  [1] Caller requesting NON-URGENT health information AND [2] PCP's office is the best resource  Answer Assessment - Initial Assessment Questions 1. REASON FOR CALL: What is the main reason for your call? or How can I best help you?  Pt called in requesting an abx for potential bronchitis. He states his daughter is an Nutritional Therapist at [employer] and is currently staying with him. She listened to his lungs and confirms chest congestion. Pt states he would prefer to have an abx sent in. He did advise that he is allergic to PCN and has had reactions to keflex. He states in the past he has had to take clarithromycin. He would like rx sent to [facility]. Please advise.  Protocols used: Information Only Call - No Triage-A-AH

## 2024-02-05 NOTE — Telephone Encounter (Signed)
 Patient aware.NFN

## 2024-03-19 ENCOUNTER — Ambulatory Visit: Admitting: *Deleted

## 2024-03-19 ENCOUNTER — Ambulatory Visit: Admitting: Emergency Medicine

## 2024-03-19 ENCOUNTER — Encounter: Payer: Self-pay | Admitting: Emergency Medicine

## 2024-03-19 VITALS — BP 108/60 | HR 59 | Ht 70.0 in | Wt 175.0 lb

## 2024-03-19 DIAGNOSIS — R911 Solitary pulmonary nodule: Secondary | ICD-10-CM | POA: Diagnosis not present

## 2024-03-19 DIAGNOSIS — J9611 Chronic respiratory failure with hypoxia: Secondary | ICD-10-CM | POA: Diagnosis not present

## 2024-03-19 DIAGNOSIS — Z87891 Personal history of nicotine dependence: Secondary | ICD-10-CM

## 2024-03-19 DIAGNOSIS — J449 Chronic obstructive pulmonary disease, unspecified: Secondary | ICD-10-CM

## 2024-03-19 LAB — PULMONARY FUNCTION TEST
DL/VA % pred: 76 %
DL/VA: 3.1 ml/min/mmHg/L
DLCO cor % pred: 45 %
DLCO cor: 11.62 ml/min/mmHg
DLCO unc % pred: 45 %
DLCO unc: 11.62 ml/min/mmHg
FEF 25-75 Post: 0.43 L/s
FEF 25-75 Pre: 0.37 L/s
FEF2575-%Change-Post: 13 %
FEF2575-%Pred-Post: 17 %
FEF2575-%Pred-Pre: 15 %
FEV1-%Change-Post: 6 %
FEV1-%Pred-Post: 32 %
FEV1-%Pred-Pre: 30 %
FEV1-Post: 1.05 L
FEV1-Pre: 0.99 L
FEV1FVC-%Change-Post: 2 %
FEV1FVC-%Pred-Pre: 61 %
FEV6-%Change-Post: 6 %
FEV6-%Pred-Post: 53 %
FEV6-%Pred-Pre: 50 %
FEV6-Post: 2.21 L
FEV6-Pre: 2.07 L
FEV6FVC-%Change-Post: 2 %
FEV6FVC-%Pred-Post: 103 %
FEV6FVC-%Pred-Pre: 100 %
FVC-%Change-Post: 3 %
FVC-%Pred-Post: 51 %
FVC-%Pred-Pre: 49 %
FVC-Post: 2.27 L
FVC-Pre: 2.18 L
Post FEV1/FVC ratio: 46 %
Post FEV6/FVC ratio: 97 %
Pre FEV1/FVC ratio: 45 %
Pre FEV6/FVC Ratio: 95 %
RV % pred: 207 %
RV: 5.11 L
TLC % pred: 108 %
TLC: 7.66 L

## 2024-03-19 NOTE — Assessment & Plan Note (Signed)
 A 5 mm nodule was seen in the left lower lobe on a CT angio chest done 11/2022.  This was smaller on his follow-up scan 11/2023.  Reassured him about this.  No dedicated follow-up for the nodule necessary.  He will have serial imaging for his thoracic aortic aneurysm.

## 2024-03-19 NOTE — Progress Notes (Signed)
 "  Subjective:    Patient ID: Benjamin [name], male    DOB: Jul 05, 1950, 74 y.o.   MRN: [id number]  HPI 74 year old man who is a former smoker (30 pk-yrs) with a history of COPD, hypertension, hyperlipidemia.  He has supplemental oxygen , uses it sparingly. He follows his SpO2 > 89-93%.  He had a new 5 mm left lower lobe pulmonary nodule on CT scan of the chest that was done 11/12/2022, decreased in size under surveillance CT angio (to evaluate thoracic aorta) done 11/22/2023. Currently managed on Trelegy He had flaring symptoms in early December and I sent prescriptions for doxycycline  and prednisone  for him. Today he reports that he feels better. He describes pretty good functional capacity - works out 3x a week. He will get some SOB w hills, stairs. Occasional cough - mostly in the am, clear sputum. Also coughs some w the Trelegy. He does rinse and gargle. Uses albuterol  rarely.   Pulmonary function testing performed today and reviewed by me show very severe obstruction without a bronchodilator response.  FEV1 0.99 L (30% predicted), hyperinflated lung volumes, decreased diffusion capacity that does not fully correct when adjusted for his alveolar volume.  The FEV1 is decreased slightly compared with his prior spirometry 4 years ago.    Review of Systems As per HPI  Past Medical History:  Diagnosis Date   Alcohol abuse    Ascending aorta dilation    BPH (benign prostatic hyperplasia)    Cirrhosis of liver (HCC)    COPD (chronic obstructive pulmonary disease) (HCC)    Diverticulosis    ED (erectile dysfunction)    External hemorrhoid    Heart failure (HCC)    Hepatitis C without hepatic coma    Hypertension    Internal hemorrhoid    Kidney stone    Male erectile dysfunction, unspecified    Personal history of colonic adenomas 05/15/2012   Personal history of colonic polyps     Family History  Problem Relation Age of Onset   Ovarian cancer Mother    Thyroid  disease Mother     Leukemia Mother    Bone cancer Mother    Prostate cancer Father    Hypertension Father    Colon cancer Neg Hx      Social History   Socioeconomic History   Marital status: Married    Spouse name: Not on file   Number of children: 2   Years of education: Not on file   Highest education level: Not on file  Occupational History   Occupation: Retired-electric horticulturist, commercial  Tobacco Use   Smoking status: Former    Current packs/day: 0.00    Average packs/day: 2.0 packs/day for 30.0 years (60.0 ttl pk-yrs)    Types: Cigarettes    Start date: 23    Quit date: 1999    Years since quitting: 27.0   Smokeless tobacco: Never  Substance and Sexual Activity   Alcohol use: No    Alcohol/week: 0.0 standard drinks of alcohol   Drug use: Yes    Types: Marijuana   Sexual activity: Not on file  Other Topics Concern   Not on file  Social History Narrative   Not on file   Social Drivers of Health   Tobacco Use: Medium Risk (03/19/2024)   Patient History    Smoking Tobacco Use: Former    Smokeless Tobacco Use: Never    Passive Exposure: Not on Actuary Strain: Not on file  Food Insecurity: Not  on file  Transportation Needs: Not on file  Physical Activity: Not on file  Stress: Not on file  Social Connections: Not on file  Intimate Partner Violence: Not on file  Depression (PHQ2-9): Low Risk (07/13/2021)   Depression (PHQ2-9)    PHQ-2 Score: 0  Alcohol Screen: Not on file  Housing: Not on file  Utilities: Not on file  Health Literacy: Not on file  He was an personnel officer, product/process development scientist.     Allergies[1]  Medications Ordered Prior to Encounter[2]       Objective:   Physical Exam Vitals:   03/19/24 1537  BP: 108/60  Pulse: (!) 59  SpO2: 94%  Weight: 175 lb (79.4 kg)  Height: 5' 10 (1.778 m)   Gen: Pleasant, well-nourished, in no distress,  normal affect  ENT: No lesions,  mouth clear,  oropharynx clear, no postnasal drip  Neck: No JVD, no  stridor  Lungs: No use of accessory muscles, no crackles or wheezing on normal respiration, no wheeze on forced expiration  Cardiovascular: RRR, heart sounds normal, no murmur or gallops, no peripheral edema  Musculoskeletal: No deformities, no cyanosis or clubbing  Neuro: alert, awake, non focal  Skin: Warm, no lesions or rash       Assessment & Plan:   COPD (chronic obstructive pulmonary disease) (HCC) Overall clinically stable.  He had a flare at the beginning of December that improved with prednisone  and doxycycline .  He tolerates Trelegy, benefits from it.  He has good functional capacity when he is not flaring.  He goes to the gym 3 times a week.  His oxygen  compliance may be marginal.  He states that he follows his SpO2.    Chronic respiratory failure with hypoxia (HCC) He has supplemental oxygen , rarely uses it.  Discussed with him the potential need to wear it with exertion.  Have encouraged him to keep track of his SpO2 and adhere to the goal of maintaining > 90%.  He understands.  Pulmonary nodule A 5 mm nodule was seen in the left lower lobe on a CT angio chest done 11/2022.  This was smaller on his follow-up scan 11/2023.  Reassured him about this.  No dedicated follow-up for the nodule necessary.  He will have serial imaging for his thoracic aortic aneurysm.    I personally spent a total of 30 minutes in the care of the patient today including preparing to see the patient, getting/reviewing separately obtained history, performing a medically appropriate exam/evaluation, counseling and educating, placing orders, documenting clinical information in the EHR, independently interpreting results, and communicating results.   Lamar Chris, MD, PhD 03/19/2024, 4:04 PM [facility] [phone] or if no answer before 7:00PM call [phone] For any issues after 7:00PM please call eLink [phone]      [1]  Allergies Allergen Reactions    Quinolones Other (See Comments)    Ascending thoracic aortic aneurysm    Keflex [Cephalexin] Rash   Penicillins Rash  [2]  Current Outpatient Medications on File Prior to Visit  Medication Sig Dispense Refill   albuterol  (PROVENTIL ) (2.5 MG/3ML) 0.083% nebulizer solution Take 3 mLs (2.5 mg total) by nebulization every 4 (four) hours as needed for wheezing or shortness of breath. 75 mL 12   albuterol  (VENTOLIN  HFA) 108 (90 Base) MCG/ACT inhaler Inhale 2 puffs into the lungs every 6 (six) hours as needed for wheezing or shortness of breath. 8.5 g 5   alfuzosin (UROXATRAL) 10 MG 24 hr tablet Take 10 mg  by mouth daily with breakfast.     Ascorbic Acid (VITAMIN C) 1000 MG tablet Take 1,000 mg by mouth daily.     aspirin  EC 81 MG tablet Take 1 tablet (81 mg total) by mouth daily. Swallow whole.     CIALIS 5 MG tablet Take 5 mg by mouth daily as needed for erectile dysfunction.     Fluticasone-Umeclidin-Vilant (TRELEGY ELLIPTA ) 100-62.5-25 MCG/ACT AEPB Inhale 1 puff into the lungs daily. 60 each 11   Fluticasone-Umeclidin-Vilant (TRELEGY ELLIPTA ) 100-62.5-25 MCG/ACT AEPB Inhale 1 puff into the lungs daily.     ibuprofen  (ADVIL ,MOTRIN ) 600 MG tablet Take 1 tablet (600 mg total) by mouth every 8 (eight) hours as needed. 15 tablet 0   lisinopril (ZESTRIL) 40 MG tablet Take 40 mg by mouth daily.     Multiple Vitamin (MULTIVITAMIN) tablet Take 1 tablet by mouth daily.     rosuvastatin  (CRESTOR ) 20 MG tablet Take 1 tablet (20 mg total) by mouth daily. 90 tablet 3   doxycycline  (VIBRA -TABS) 100 MG tablet Take 1 tablet (100 mg total) by mouth 2 (two) times daily. (Patient not taking: Reported on 03/19/2024) 14 tablet 0   predniSONE  (DELTASONE ) 10 MG tablet Take 40mg  daily for 3 days, then 30mg  daily for 3 days, then 20mg  daily for 3 days, then 10mg  daily for 3 days, then stop (Patient not taking: Reported on 03/19/2024) 30 tablet 0   No current facility-administered medications on file prior to visit.   "

## 2024-03-19 NOTE — Patient Instructions (Signed)
 Full PFT performed today.

## 2024-03-19 NOTE — Patient Instructions (Signed)
 We reviewed your CT scan of the chest from 11/2023 today.  The pulmonary nodule that had been seen was smaller.  Good news. Continue Trelegy once daily.  Rinse and gargle after using. Keep your albuterol  available to use 2 puffs when needed for shortness of breath, chest tightness, wheezing. You should plan to get the flu shot every fall Keep your oxygen  available to use 2 L/min with exertion with the goal of keeping your saturations > 90% Follow Dr. Shelah in 6 months.  Please call sooner if you have any problems.

## 2024-03-19 NOTE — Progress Notes (Signed)
 Full PFT performed today.

## 2024-03-19 NOTE — Assessment & Plan Note (Signed)
 Overall clinically stable.  He had a flare at the beginning of December that improved with prednisone  and doxycycline .  He tolerates Trelegy, benefits from it.  He has good functional capacity when he is not flaring.  He goes to the gym 3 times a week.  His oxygen  compliance may be marginal.  He states that he follows his SpO2.

## 2024-03-19 NOTE — Assessment & Plan Note (Signed)
 He has supplemental oxygen , rarely uses it.  Discussed with him the potential need to wear it with exertion.  Have encouraged him to keep track of his SpO2 and adhere to the goal of maintaining > 90%.  He understands.
